# Patient Record
Sex: Female | Born: 1970 | Race: White | Hispanic: No
Health system: Midwestern US, Academic
[De-identification: ages and names within clinical notes are randomized; demographics above are authoritative.]

---

## 2018-03-14 ENCOUNTER — Encounter: Admit: 2018-03-14 | Discharge: 2018-03-14 | Payer: MEDICARE | Primary: Rheumatology

## 2018-03-14 ENCOUNTER — Ambulatory Visit: Admit: 2018-03-14 | Discharge: 2018-03-14 | Payer: MEDICARE | Primary: Rheumatology

## 2018-03-14 DIAGNOSIS — M3500 Sicca syndrome, unspecified: Principal | ICD-10-CM

## 2018-03-14 DIAGNOSIS — I7300 Raynaud's syndrome without gangrene: Secondary | ICD-10-CM

## 2018-03-14 DIAGNOSIS — I10 Essential (primary) hypertension: ICD-10-CM

## 2018-03-14 DIAGNOSIS — E785 Hyperlipidemia, unspecified: ICD-10-CM

## 2018-03-14 DIAGNOSIS — K219 Gastro-esophageal reflux disease without esophagitis: ICD-10-CM

## 2018-03-14 DIAGNOSIS — E669 Obesity, unspecified: Principal | ICD-10-CM

## 2018-03-14 LAB — C4 COMPLEMENT 4: Lab: 38 mg/dL (ref 10–49)

## 2018-03-14 LAB — HEPATITIS C ANTIBODY W REFLEX HCV PCR QUANT: Lab: NEGATIVE

## 2018-03-14 LAB — IMMUNOGLOBULINS-IGA,IGG,IGM
Lab: 102 mg/dL (ref 762–1488)
Lab: 208 mg/dL (ref 70–390)

## 2018-03-14 LAB — HEPATITIS B SURFACE AG: Lab: NEGATIVE

## 2018-03-14 LAB — HEPATITIS B CORE AB TOT (IGG+IGM): Lab: NEGATIVE

## 2018-03-14 LAB — RHEUMATOID FACTOR (RF): Lab: <10 [IU]/mL (ref <25)

## 2018-03-14 LAB — C3 COMPLEMENT 3: Lab: 153 mg/dL (ref 88–200)

## 2018-03-14 LAB — HEPATITIS B SURFACE AB

## 2018-03-14 LAB — BETA 2 MICROGLOBULIN: Lab: 2.3 mg/L (ref 0.8–2.3)

## 2018-03-15 LAB — CENTROMERE ANTIBODIES

## 2018-03-15 LAB — ANTI SSA ANTI SSB AB

## 2018-03-15 LAB — ELECTROPHORESIS-SERUM PROTEIN
Lab: 10 % (ref 5–15)
Lab: 13 % (ref 9–21)
Lab: 59 % (ref 48–68)
Lab: 6.7 g/dL (ref 6.0–8.0)

## 2018-03-15 LAB — ANTI-NUCLEAR ANTIBODY(ANA)

## 2018-03-15 LAB — IMMUNOFIXATION, SERUM (IFES)

## 2018-03-15 LAB — ANTI-NUCLEAR AB(ANA)-QUANT: Lab: 640 — ABNORMAL HIGH (ref <80)

## 2018-03-18 LAB — CRYOGLOBULIN W REFLEX IMMUNOFIXATION: Lab: NEGATIVE

## 2018-05-14 ENCOUNTER — Encounter: Admit: 2018-05-14 | Discharge: 2018-05-14 | Payer: MEDICARE | Primary: Rheumatology

## 2018-05-14 ENCOUNTER — Encounter: Admit: 2018-05-14 | Discharge: 2018-05-14 | Payer: MEDICARE

## 2018-05-14 ENCOUNTER — Ambulatory Visit: Admit: 2018-05-14 | Discharge: 2018-05-15 | Payer: MEDICARE

## 2018-05-14 DIAGNOSIS — E785 Hyperlipidemia, unspecified: Secondary | ICD-10-CM

## 2018-05-14 DIAGNOSIS — E669 Obesity, unspecified: Secondary | ICD-10-CM

## 2018-05-14 DIAGNOSIS — K219 Gastro-esophageal reflux disease without esophagitis: Secondary | ICD-10-CM

## 2018-05-14 DIAGNOSIS — I10 Essential (primary) hypertension: Secondary | ICD-10-CM

## 2018-05-14 MED ORDER — PILOCARPINE HCL 5 MG PO TAB
5 mg | ORAL_TABLET | Freq: Three times a day (TID) | ORAL | 3 refills | Status: AC
Start: 2018-05-14 — End: ?

## 2018-05-15 DIAGNOSIS — H04129 Dry eye syndrome of unspecified lacrimal gland: Secondary | ICD-10-CM

## 2018-05-15 DIAGNOSIS — R682 Dry mouth, unspecified: Secondary | ICD-10-CM

## 2018-05-15 DIAGNOSIS — M3500 Sicca syndrome, unspecified: Secondary | ICD-10-CM

## 2018-06-27 ENCOUNTER — Encounter: Admit: 2018-06-27 | Discharge: 2018-06-27 | Payer: MEDICARE

## 2018-07-01 ENCOUNTER — Encounter: Admit: 2018-07-01 | Discharge: 2018-07-01 | Payer: MEDICARE | Primary: Rheumatology

## 2018-07-01 NOTE — Telephone Encounter
Patient called and asked if there are any other prescription that the doctor can prescribed. The one that she is on currently is making her sweat.

## 2018-07-01 NOTE — Telephone Encounter
Pt called and asked if there is another medication besides pilocarpine.     Adivsed I would ask Dr. Cecelia Byars but she needs to let Dr. Coralie Carpen office know as she was placed on pilocarpine by Dr. Milus Banister.     She stated understanding.   Asked if I could mychart any responses and recs from the physician and she said yes.

## 2018-07-02 ENCOUNTER — Encounter: Admit: 2018-07-02 | Discharge: 2018-07-02 | Payer: MEDICARE | Primary: Rheumatology

## 2018-07-02 NOTE — Telephone Encounter
There is only one option other than pilocarpine called Evoxac  I see she is scheduled to see me in late March, will be happy to discuss this further with her if she would like

## 2018-07-26 ENCOUNTER — Encounter: Admit: 2018-07-26 | Discharge: 2018-07-26 | Payer: MEDICARE | Primary: Rheumatology

## 2018-07-30 ENCOUNTER — Encounter: Admit: 2018-07-30 | Discharge: 2018-07-30 | Payer: MEDICARE | Primary: Rheumatology

## 2018-08-01 ENCOUNTER — Encounter: Admit: 2018-08-01 | Discharge: 2018-08-01 | Payer: MEDICARE | Primary: Rheumatology

## 2018-08-02 NOTE — Progress Notes
4.40 pm on April 20th

## 2018-08-14 ENCOUNTER — Encounter: Admit: 2018-08-14 | Discharge: 2018-08-14 | Payer: MEDICARE | Primary: Rheumatology

## 2018-08-20 ENCOUNTER — Encounter: Admit: 2018-08-20 | Discharge: 2018-08-20 | Payer: MEDICARE | Primary: Rheumatology

## 2018-08-20 ENCOUNTER — Ambulatory Visit: Admit: 2018-08-20 | Discharge: 2018-08-21 | Payer: MEDICARE | Primary: Rheumatology

## 2018-08-20 ENCOUNTER — Encounter: Admit: 2018-08-20 | Discharge: 2018-08-20 | Payer: MEDICARE

## 2018-08-20 DIAGNOSIS — I10 Essential (primary) hypertension: ICD-10-CM

## 2018-08-20 DIAGNOSIS — E785 Hyperlipidemia, unspecified: ICD-10-CM

## 2018-08-20 DIAGNOSIS — E669 Obesity, unspecified: Principal | ICD-10-CM

## 2018-08-20 DIAGNOSIS — K219 Gastro-esophageal reflux disease without esophagitis: ICD-10-CM

## 2018-08-20 MED ORDER — NORTRIPTYLINE 10 MG PO CAP
10 mg | ORAL_CAPSULE | Freq: Every evening | ORAL | 0 refills | Status: DC
Start: 2018-08-20 — End: 2018-08-30

## 2018-08-20 MED ORDER — CEVIMELINE 30 MG PO CAP
30 mg | ORAL_CAPSULE | Freq: Three times a day (TID) | ORAL | 0 refills | Status: DC
Start: 2018-08-20 — End: 2018-09-02

## 2018-08-20 NOTE — Progress Notes
this led to excessive sweating, this usually starts 45 minutes after the dose last for several hours, and severe enough to her clothes, she also occasionally gets nausea, however, her memory problems and fatigue have been the most troublesome symptoms,  Pain mainly affects her upper and lower back, she reports aching moderately severe pain, she has been on several pharmacological therapy and is currently using maximum dose of Lyrica 450 mg daily, along with naltrexone given for GI symptoms, she tried several muscle relaxant but this led to side effects, amitriptyline previously led to increased appetite and had to be stopped but she never tried Pamelor no other complaints        Past Medical History:  Reviewed in O2    Past surgical History:  Reviewed in O2    Family History:  Family history reviewed, non-contributory      Social History:  Reviewed in O2      Allergies:   Reviewed in the records      Current medication list:  Reviewed      Physical Examination:    GENERAL APPEARANCE: The patient is a obese adult in no acute distress, pleasant    Five minute Schirmer's test without anesthesia:    OS: 7 mm     OD: 4 mm        Five minute Unstimulated salivary flow rate:    0.16  ml in 5 minutes =  0.032 ml/minute (very low)      Assessment:      1.  Fibromyalgia syndrome, classic presentation on maximal dose of lyrica and naltrexone. Tried baclofen and hydrocodone. Flexeril led to suicidal thoughts.   The plan today is to start a small dose of Pamelor 10 mg daily, unfortunately there are no other available options that we can use, she is already on high dose naltrexone given for different indication  I explained that there is no proven therapies for fatigue except aerobic exercises which she will try to do next few weeks    2.  Positive ANA    3.  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  Dry mouth with subjective evidence of dryness on unstimulated salivary flow rate

## 2018-08-21 DIAGNOSIS — M797 Fibromyalgia: Principal | ICD-10-CM

## 2018-08-22 ENCOUNTER — Encounter: Admit: 2018-08-22 | Discharge: 2018-08-22 | Payer: MEDICARE | Primary: Rheumatology

## 2018-08-30 MED ORDER — NORTRIPTYLINE 10 MG PO CAP
10 mg | ORAL_CAPSULE | Freq: Every evening | ORAL | 2 refills | Status: DC
Start: 2018-08-30 — End: 2019-02-13

## 2018-09-02 MED ORDER — CEVIMELINE 30 MG PO CAP
30 mg | ORAL_CAPSULE | Freq: Three times a day (TID) | ORAL | 2 refills | Status: DC
Start: 2018-09-02 — End: 2018-11-21

## 2018-09-11 ENCOUNTER — Encounter: Admit: 2018-09-11 | Discharge: 2018-09-11 | Payer: MEDICARE | Primary: Rheumatology

## 2018-09-24 ENCOUNTER — Encounter: Admit: 2018-09-24 | Discharge: 2018-09-24 | Payer: MEDICARE | Primary: Rheumatology

## 2018-09-25 ENCOUNTER — Encounter: Admit: 2018-09-25 | Discharge: 2018-09-25 | Payer: MEDICARE | Primary: Rheumatology

## 2018-10-08 ENCOUNTER — Encounter: Admit: 2018-10-08 | Discharge: 2018-10-08 | Primary: Rheumatology

## 2018-11-21 ENCOUNTER — Encounter: Admit: 2018-11-21 | Discharge: 2018-11-21 | Primary: Rheumatology

## 2018-11-21 DIAGNOSIS — E669 Obesity, unspecified: Secondary | ICD-10-CM

## 2018-11-21 DIAGNOSIS — K219 Gastro-esophageal reflux disease without esophagitis: Secondary | ICD-10-CM

## 2018-11-21 DIAGNOSIS — I10 Essential (primary) hypertension: Secondary | ICD-10-CM

## 2018-11-21 DIAGNOSIS — E785 Hyperlipidemia, unspecified: Secondary | ICD-10-CM

## 2018-11-21 DIAGNOSIS — Z79899 Other long term (current) drug therapy: Principal | ICD-10-CM

## 2018-11-21 MED ORDER — CEVIMELINE 30 MG PO CAP
ORAL_CAPSULE | Freq: Four times a day (QID) | ORAL | 3 refills | Status: DC
Start: 2018-11-21 — End: 2018-12-31

## 2018-11-21 MED ORDER — HYDROXYCHLOROQUINE 200 MG PO TAB
ORAL_TABLET | Freq: Every day | ORAL | 1 refills | 90.00000 days | Status: AC
Start: 2018-11-21 — End: ?

## 2018-11-21 MED ORDER — CEVIMELINE 30 MG PO CAP
30 mg | ORAL_CAPSULE | Freq: Three times a day (TID) | ORAL | 3 refills | Status: DC
Start: 2018-11-21 — End: 2018-11-21

## 2018-11-21 NOTE — Progress Notes
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 for which she was evaluated by dermatologist and work-up revealed a positive ANA, but no follow-up was performed thereafter  Subsequently she saw a new dermatologist who gave her this intralesional steroid injections and repeated her ANA along with other testing, again this came back positive for ANA but otherwise was negative for ENA, see below  She reports that over the last 6 years she has been reporting significant level of diffuse musculoskeletal pain, fatigue and muscle weakness, she hurts all over and reports that all the symptoms were preceded by 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  She also reports that over the last 3 years she has been noticing color change to her hands when she starts her car during cold weather, this would improve a few minutes later with rewarming, I showed her photos of Raynaud's phenomenon and she confirms the clinical suspicion  There is no history of digital tip ulcerations  In addition to that, she tells me that she has been dealing with dry mouth for several years, she struggled to swallow dry food without fluid and carries a bottle of water as she feels thirsty all the time  There is no history of dry eyes or prior diagnosis of eye dryness by ophthalmologist  The daughter does note intermittent history of swelling of the parotid gland area bilaterally,  Associated symptoms include short-term memory problems, nonrestorative sleep, TMJ pain, history of IBS and migraine headaches  For her neck pain she was put on pregabalin which she used for about 2 years with some relief of her neck pain  Review of systems also positive for recurrent oral thrush and history of vitamin B deficiency for which she uses multivitamin tablets   finally, she notes that, will give him prednisone for management of URI she would not notice any improvement of her musculoskeletal pain or fatigue  She had a history of gastric sleeve for management of morbid obesity  I have reviewed her medical records including a note from February 2019 she then presented with 2-day history of rashes following the Lamictal use and was started on prednisone, I see that she was found to have a positive ANA since May 2018 which was ordered by dermatology service  She was treated with Diflucan for management of thrush involving her mouth and esophagus per the notes  I have reviewed her laboratory testing from February 2019, CBC was acceptable, ANA was +1: 160 in a speckled pattern  Smith/RNP antibodies, SSA and SSB, dsDNA, SCL 70, Jo 1, centromere, and chromatin antibodies were all negative  T3 and 4 were normal RPR was negative  I also see that she underwent a 2D echo in early 2018 which was normal, peak systolic pulmonary pressure was 22    Complete ROS was performed and is otherwise negative except for the presence of joint pain, muscle pain, fatigue, weakness, dry mouth, and nonrestorative sleep as outlined above    Today:  She was last seen in April at which time I switched her from pilocarpine to you was asked for management of her sicca syndrome, it seems that she tolerated the treatment up to 3 times daily with occasional swelling and reports some  improvement of her sicca although she continues to have significant dryness of her mouth and would like to see if she can use higher doses, she also continues to report joint pain compatible with a diagnosis fibromyalgia syndrome but also pain in the hands and feet associated with prolonged morning stiffness and some swelling, we discussed hydroxychloroquine today   I gave her Pamelor for management of fibromyalgia pain last visit, unfortunately this led to significant worsening of her depression to the point that she needed to be admitted, upon discharge she was switched to Wellbutrin and Abilify and feels better now  She has no other complaints except for ongoing fatigue    Complete review of systems performed otherwise negative except for joint pain and fatigue, sicca syndrome      Past Medical History:  Reviewed in O2    Past surgical History:  Reviewed in O2    Family History:  Family history reviewed, non-contributory      Social History:  Reviewed in O2      Allergies:   Reviewed in the records      Current medication list:  Reviewed    Physical Examination:    VITAL SIGNS: BP 118/78 (BP Source: Arm, Right Upper, Patient Position: Sitting)  - Pulse 80  - Temp 36.8 ???C (98.3 ???F) (Oral)  - Resp 16  - Ht 154.9 cm (60.98)  - Wt 88.2 kg (194 lb 6.4 oz)  - SpO2 100%  - BMI 36.76 kg/m???      GENERAL APPEARANCE: The patient is a pleasant adult in no acute distress    EYES: No scleral erythema or conjunctival injection.    ENT: No oral ulcers or parotid enlargement.    NECK: No masses or thyroid enlargement.    LYMPHATIC: No cervical, supraclavicular lymphadenopathy    CARDIOVASCULAR: Heart rhythm is regular. No murmur, rub or gallop.    CHEST: Normal vesicular breath sounds. No wheezes, rales, pleural or friction rubs.    EXTREMITIES: There is no evidence of clubbing, cyanosis, or edema.    SKIN: No rash, palpable purpura, digital ulcers, abnormal thickening or tight skin    NEUROLOGICAL: Normal gait and station, full strength in upper and lower extremities, normal sensation to light touch.    MUSCULOSKELETAL: The joints of the upper and lower extremities have full range of motion, no tenderness, swelling, deformity   except:  Tenderness noted over many of her MCPs and PIPs but also transmetatarsal tenderness of both feet, some swelling is noted of some of the MCPs more noticeable on the right side Five minute Schirmer's test without anesthesia:    OS: 7 mm   OD: 4 mm        Five minute Unstimulated salivary flow rate:    0.16  ml in 5 minutes =  0.032 ml/minute (very low)      Assessment and plan:    1.  Sjogren's syndrome, symptoms include sicca syndrome, inflammatory joint pain, fatigue, late onset Raynaud's phenomenon, abnormal Schirmer's test and unstimulated salivary flow rate and positive with biopsy although the focus score was not calculated      2.  Fibromyalgia syndrome, classic presentation on maximal dose of lyrica and naltrexone. Tried baclofen and hydrocodone. Flexeril and Pamelor led to suicidal thoughts.     3.  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    We will manage her ongoing sicca syndrome by increasing dose of Evoxac to 4 times  per day, I will also initiate hydroxychloroquine for management of her inflammatory joint pain and potentially fatigue, side effects were extensively discussed including retinal toxicity, skin pigmentation on the face and neck but other parts of the body, rashes and GI disturbances, all questions were answered      Return to the clinic in approximately 3 months       Leverne Humbles, MD    Because this dictation was prepared with voice recognition software, there remains a potential for typographical errors or incorrect word choices by the system. We apologize for any inadvertent inconvenience from such an error.

## 2018-11-21 NOTE — Patient Instructions
3 month follow up    Start plaquenil    Increase evoxac to 4 times daily

## 2018-11-22 ENCOUNTER — Ambulatory Visit: Admit: 2018-11-21 | Discharge: 2018-11-22 | Primary: Rheumatology

## 2018-11-22 ENCOUNTER — Encounter: Admit: 2018-11-22 | Discharge: 2018-11-22 | Primary: Rheumatology

## 2018-11-22 NOTE — Telephone Encounter
[facility] needed clarification and diagnosis codes for hcq and cevimeline, called them and provided.

## 2018-11-25 ENCOUNTER — Encounter: Admit: 2018-11-25 | Discharge: 2018-11-25 | Primary: Rheumatology

## 2018-11-26 NOTE — Telephone Encounter
Called [facility].   They sent 10 days to start, and they are sending the rest.

## 2018-12-02 ENCOUNTER — Encounter: Admit: 2018-12-02 | Discharge: 2018-12-02 | Primary: Rheumatology

## 2018-12-04 ENCOUNTER — Encounter: Admit: 2018-12-04 | Discharge: 2018-12-04 | Primary: Rheumatology

## 2018-12-04 NOTE — Telephone Encounter
Is this with  1 tablet daily?  If so, then we will consider switching her to something else such as methotrexate and leflunomide, please send her handout

## 2018-12-11 ENCOUNTER — Encounter: Admit: 2018-12-11 | Discharge: 2018-12-11 | Primary: Rheumatology

## 2018-12-11 ENCOUNTER — Ambulatory Visit: Admit: 2018-12-11 | Discharge: 2018-12-11 | Primary: Rheumatology

## 2018-12-11 DIAGNOSIS — E669 Obesity, unspecified: Secondary | ICD-10-CM

## 2018-12-11 DIAGNOSIS — K219 Gastro-esophageal reflux disease without esophagitis: Secondary | ICD-10-CM

## 2018-12-11 DIAGNOSIS — Z5181 Encounter for therapeutic drug level monitoring: Secondary | ICD-10-CM

## 2018-12-11 DIAGNOSIS — I7300 Raynaud's syndrome without gangrene: Secondary | ICD-10-CM

## 2018-12-11 DIAGNOSIS — M1990 Unspecified osteoarthritis, unspecified site: Secondary | ICD-10-CM

## 2018-12-11 DIAGNOSIS — M797 Fibromyalgia: Secondary | ICD-10-CM

## 2018-12-11 DIAGNOSIS — E785 Hyperlipidemia, unspecified: Secondary | ICD-10-CM

## 2018-12-11 DIAGNOSIS — I10 Essential (primary) hypertension: Secondary | ICD-10-CM

## 2018-12-11 DIAGNOSIS — M3500 Sicca syndrome, unspecified: Principal | ICD-10-CM

## 2018-12-11 NOTE — Progress Notes
TELEHEALTH VISIT    Date of Service: 12/11/2018    Subjective:             Anna Thompson is a 48 y.o. female.    History of Present Illness    Chief complaint: Seronegative Sjogren's syndrome follow-up    Primary Rheumatologist:  Dr. Cecelia Byars  - Last seen 11/21/18  ???  History of present illness:   She tells me that about 6 years ago she started feeling unwell, she describes that she had central alopecia for which she was evaluated by dermatologist and work-up revealed a positive ANA, but no follow-up was performed thereafter  Subsequently she saw a new dermatologist who gave her this intralesional steroid injections and repeated her ANA along with other testing, again this came back positive for ANA but otherwise was negative for ENA, see below  She reports that over the last 6 years she has been reporting significant level of diffuse musculoskeletal pain, fatigue and muscle weakness, she hurts all over and reports that all the symptoms were preceded by 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  She also reports that over the last 3 years she has been noticing color change to her hands when she starts her car during cold weather, this would improve a few minutes later with rewarming, I showed her photos of Raynaud's phenomenon and she confirms the clinical suspicion  There is no history of digital tip ulcerations  In addition to that, she tells me that she has been dealing with dry mouth for several years, she struggled to swallow dry food without fluid and carries a bottle of water as she feels thirsty all the time  There is no history of dry eyes or prior diagnosis of eye dryness by ophthalmologist  The daughter does note intermittent history of swelling of the parotid gland area bilaterally,  Associated symptoms include short-term memory problems, nonrestorative sleep, TMJ pain, history of IBS and migraine headaches  For her neck pain she was put on pregabalin which she used for about 2 years with some relief of her neck pain  Review of systems also positive for recurrent oral thrush and history of vitamin B deficiency for which she uses multivitamin tablets   finally, she notes that, will give him prednisone for management of URI she would not notice any improvement of her musculoskeletal pain or fatigue  She had a history of gastric sleeve for management of morbid obesity  I have reviewed her medical records including a note from February 2019 she then presented with 2-day history of rashes following the Lamictal use and was started on prednisone, I see that she was found to have a positive ANA since May 2018 which was ordered by dermatology service  She was treated with Diflucan for management of thrush involving her mouth and esophagus per the notes  I have reviewed her laboratory testing from February 2019, CBC was acceptable, ANA was +1: 160 in a speckled pattern  Smith/RNP antibodies, SSA and SSB, dsDNA, SCL 70, Jo 1, centromere, and chromatin antibodies were all negative  T3 and 4 were normal RPR was negative  I also see that she underwent a 2D echo in early 2018 which was normal, peak systolic pulmonary pressure was 22  ???  Complete ROS was performed and is otherwise negative except for the presence of joint pain, muscle pain, fatigue, weakness, dry mouth, and nonrestorative sleep as outlined above  ???  Today:    She  was started on hydroxychloroquine at her last visit on 11/21/18. Unfortunately, she had nausea with it. She stopped the HCQ and it resolved.  She continues to have pain in her hands and feet. She also has knees and neck pain.  She has not noticed much swelling.   Hands are stiff in the morning for about an hour.   Oral dryness has improved with Evoxac 4 times daily. She uses restasis and saline eye drops.   She is concerned about any medicines with nausea. 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   Prednisone has not been helpful for her in the past. She does not drink alcohol or smoke.  Denies fevers, chills, recent infection/illness, cough, dyspnea, chest pain, heart palpitation, n/v/d/c, unexplained weight loss, or night sweats.          Review of Systems   HENT: Positive for tinnitus.    Eyes: Positive for photophobia and itching.   Gastrointestinal: Positive for constipation.   Endocrine: Positive for cold intolerance.   Genitourinary: Positive for frequency and urgency.   Musculoskeletal: Positive for arthralgias, back pain, neck pain and neck stiffness.   Allergic/Immunologic: Positive for immunocompromised state.   Neurological: Positive for dizziness, speech difficulty and light-headedness.   Hematological: Bruises/bleeds easily.   Psychiatric/Behavioral: Positive for sleep disturbance.   All other systems reviewed and are negative.    Medical History:   Diagnosis Date   ??? Essential hypertension 05/09/2016   ??? GERD (gastroesophageal reflux disease) 05/09/2016   ??? Hyperlipemia 05/09/2016   ??? Obesity 05/09/2016     Surgical History:   Procedure Laterality Date   ??? BLADDER SURGERY      bladder sling   ??? FINGER TRIGGER RELEASE Left    ??? HX CARPAL TUNNEL RELEASE     ??? HX HYSTERECTOMY     ??? HX TUBAL LIGATION     ??? STOMACH SURGERY      gastric sleeve     Family History   Problem Relation Age of Onset   ??? Stroke Mother    ??? Heart Attack Father    ??? Heart Disease Father    ??? Heart Attack Brother    ??? Hypertension Brother    ??? Heart Attack Brother    ??? Hypertension Brother      Social History     Socioeconomic History   ??? Marital status: Divorced     Spouse name: Not on file   ??? Number of children: Not on file   ??? Years of education: Not on file   ??? Highest education level: Not on file   Occupational History   ??? Not on file   Tobacco Use   ??? Smoking status: Never Smoker   ??? Smokeless tobacco: Never Used   Substance and Sexual Activity   ??? Alcohol use: No   ??? Drug use: No   ??? Sexual activity: Not on file   Other Topics Concern   ??? Not on file   Social History Narrative ??? Not on file           Objective:         ??? ARIPiprazole (ABILIFY) 5 mg tablet Take 5 mg by mouth daily.   ??? buPROPion XL (WELLBUTRIN XL) 300 mg tablet Take 300 mg by mouth every morning. Do not crush or chew.   ??? calcium carbonate/vitamin D-3 (OSCAL-500+D) 1250 mg/200 unit tablet Take 1 tablet by mouth daily. Calcium Carb 1250mg  delivers 500mg  elemental Ca   ???  cevimeline (EVOXAC) 30 mg capsule One tab 4 times daily   ??? cyanocobalamin (VITAMIN B-12) 500 mcg tablet Take 500 mcg by mouth daily.   ??? cycloSPORINE (RESTASIS) 0.05 % ophthalmic emulsion Apply 1 drop to both eyes twice daily.   ??? ferrous sulfate (FEOSOL) 325 mg (65 mg iron) tablet Take 325 mg by mouth daily. Take on an empty stomach at least 1 hour before or 2 hours after food.   ??? hydrOXYchloroQUINE (PLAQUENIL) 200 mg tablet Take with food. Once daily for 2 weeks therm twice daily   ??? LORazepam (ATIVAN) 0.5 mg tablet Take 1 tablet by mouth as Needed.   ??? metaxalone(+) (SKELAXIN) 800 mg tablet Take 800 mg by mouth three times daily.   ??? montelukast (SINGULAIR) 10 mg tablet Take 10 mg by mouth at bedtime daily.   ??? MULTIVITAMIN PO Take  by mouth.   ??? naltrexone (DEPADE) 50 mg tablet Take 25 mg by mouth twice daily.   ??? nortriptyline (PAMELOR) 10 mg capsule Take one capsule by mouth at bedtime daily.   ??? omeprazole DR(+) (PRILOSEC) 40 mg capsule Take 40 mg by mouth twice daily.   ??? ondansetron (ZOFRAN) 4 mg tablet Take 4 mg by mouth every 8 hours as needed for Nausea or Vomiting.   ??? other medication 1 Dose. botox injections   ??? phytonadione (VITAMIN K) 5 mg tablet Take 5 mg by mouth once.   ??? pregabalin (LYRICA) 150 mg capsule Take 75 mg by mouth twice daily.   ??? promethazine (PHENERGAN) 25 mg tablet Take 25 mg by mouth every 6 hours as needed for Nausea or Vomiting.   ??? VIIBRYD 40 mg tab Take 1 tablet by mouth daily.   ??? vitamins, multi B, C, Zn & folate (Renal) (NEPHPLEX RX) 1-60-300-12.5 mg-mg-mcg-mg tab Take 1 tablet by mouth daily. ??? zolpidem (AMBIEN) 10 mg tablet Take 1 tablet by mouth at bedtime daily.     Vitals:    12/11/18 1429   Weight: 85.7 kg (189 lb)   Height: 154.9 cm (60.98)   PainSc: Four     Body mass index is 35.73 kg/m???.     Physical Exam  Constitutional:       General: She is not in acute distress.     Appearance: Normal appearance. She is not ill-appearing or toxic-appearing.      Comments: Limited physical exam d/t telehealth visit.    Pulmonary:      Effort: No respiratory distress.   Musculoskeletal:      Comments: No areas of obvious synovitis noted.    Skin:     Findings: No rash.   Neurological:      Mental Status: She is alert.   Psychiatric:         Mood and Affect: Mood normal.         Behavior: Behavior normal.              Assessment and Plan:    1. Sjogren's syndrome, symptoms include sicca syndrome, inflammatory joint pain, fatigue, late onset Raynaud's phenomenon, abnormal Schirmer's test and unstimulated salivary flow rate and positive with biopsy. Oral dryness has improved with evoxac 4 times daily. She continues to have joint pain and morning stiffness consistent with inflammatory joint pain. Unfortunately, she was unable to tolerate hydroxychlorquine.     Given that she has difficulty tolerating oral medications d/t her bariatric surgery, we discussed using SQ MTX. We Reviewed indications, potential risks, side effects, toxicities, immunosuppressive/immunomodulatory effects and need  for ongoing safety monitoring. She was comfortable with this plan. We will need updated labs prior to starting. Labs will be faxed to her local hospital and then she will send [place] the results electronically so we can start Methotrexate soon.     Once labs are reviewed, will start MTX SQ 10mg  (0.66mls) once weekly x 2 weeks, 12.5mg  (0.45mls) once weekly x 2 weeks, then 15mg  (0.3mls) once weekly. She uses OTC folic acid and would prefer to keep using, but I asked her to check to the dose to make sure she's taking at least 1mg  ( ) daily while on Methotrexate. I will ask our pharmacists to reach out to her as well to discuss and possibly set up an injection teach visit although she did mention she has given SQ injections to her mother in the past and is fairly comfortable with it.   ???  2.  Fibromyalgia syndrome, classic presentation on maximal dose of lyrica and naltrexone. Tried baclofen and hydrocodone. Flexeril and Pamelor led to suicidal thoughts.   ???  3.  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  She has a scheduled follow-up with Dr. Cecelia Byars in October 2020. Will keep this appointment, but I can see her back sooner via telehealth if there are problems/concerns prior to this appointment.       APRN, FNP-BC               Total time 20 minutes.

## 2018-12-12 ENCOUNTER — Encounter: Admit: 2018-12-12 | Discharge: 2018-12-12 | Primary: Rheumatology

## 2018-12-12 NOTE — Progress Notes
Attempted to reach patient for methotrexate education. Left voicemail requesting a call back. Provided patient with pharmacist clinic phone number ([phone]). If no return call from patient, will attempt again at a later date.

## 2018-12-12 NOTE — Telephone Encounter
-----   Message from [place], [place] sent at 12/12/2018  8:32 AM CDT -----  Regarding: FW: SQ MTX    ----- Message -----  From: Rance Muir, APRN-NP  Sent: 12/11/2018   3:57 PM CDT  To: Dwyane Luo, PHARMD, Jeneen Rinks, PHARMD  Subject: SQ MTX                                           Hello,    Can you please call Ms. Fusco to discuss SQ MTX? I spoke to her today via telehealth and we had a pretty long discussion about it so I don't think she will have many additional questions. I did mention you may suggest a telehealth visit for an injection teach once she gets the Methotrexate. She hasn't had labs in a few years, so I'm having her get labs this week and then will order the MTX once we get those results back.    Thanks so much!    [facility]

## 2018-12-12 NOTE — Telephone Encounter
-----   Message from Rance Muir, APRN-NP sent at 12/11/2018  3:27 PM CDT -----  Regarding: Labs  Hi Izora Gala,    Can you fax over the standing labs I ordered for Shera to [facility]?     She's going to get labs this week and then will send [place] the results so we can start her on Methotrexate.    Thanks!    [facility]

## 2018-12-12 NOTE — Telephone Encounter
Labs faxed to [facility]   [phone]

## 2018-12-16 ENCOUNTER — Encounter: Admit: 2018-12-16 | Discharge: 2018-12-16 | Payer: MEDICARE | Primary: Rheumatology

## 2018-12-16 MED ORDER — BD INSULIN SYRINGE 1 ML 25 GAUGE X 5/8" MISC SYRG
1 refills | 30.00000 days | Status: AC
Start: 2018-12-16 — End: ?

## 2018-12-16 MED ORDER — METHOTREXATE SODIUM (PF) 25 MG/ML IJ SOLN
0 refills | 28.00000 days | Status: DC
Start: 2018-12-16 — End: 2018-12-20

## 2018-12-16 NOTE — Telephone Encounter
PA started on [facility] Key: [id number]  PA Case ID: [id number]

## 2018-12-18 NOTE — Progress Notes
Returned patient's phone call inquiring about methotrexate injections. Anna Thompson called to verify how many mL she needs to draw up on syringe prior to giving herself the first injection. Reviewed dosing and how much to draw up in syringe. Anna Thompson feels comfortable giving injection as she has given her mother insulin shots for years.     Offered education on the methotrexate in terms of side effects, however, patient declined and said she already received information from her physician and retail pharmacist.

## 2018-12-20 MED ORDER — METHOTREXATE SODIUM (PF) 25 MG/ML IJ SOLN
0 refills | 28.00000 days | Status: DC
Start: 2018-12-20 — End: 2019-03-17

## 2018-12-20 NOTE — Progress Notes
MTX sent to [facility] pharmacy. Script sent.

## 2018-12-24 ENCOUNTER — Encounter: Admit: 2018-12-24 | Discharge: 2018-12-24 | Primary: Rheumatology

## 2018-12-24 NOTE — Telephone Encounter
I agree with stopping methotrexate 2 weeks before the planned surgery and to resume therapy when instructed to do so by her surgeon

## 2018-12-31 ENCOUNTER — Encounter: Admit: 2018-12-31 | Discharge: 2018-12-31 | Primary: Rheumatology

## 2018-12-31 MED ORDER — CEVIMELINE 30 MG PO CAP
ORAL_CAPSULE | Freq: Four times a day (QID) | ORAL | 3 refills | Status: DC
Start: 2018-12-31 — End: 2019-04-07

## 2019-01-14 ENCOUNTER — Encounter: Admit: 2019-01-14 | Discharge: 2019-01-14 | Payer: MEDICARE | Primary: Rheumatology

## 2019-01-15 ENCOUNTER — Encounter: Admit: 2019-01-15 | Discharge: 2019-01-15 | Payer: MEDICARE | Primary: Rheumatology

## 2019-01-20 ENCOUNTER — Encounter: Admit: 2019-01-20 | Discharge: 2019-01-20 | Payer: MEDICARE | Primary: Rheumatology

## 2019-01-22 ENCOUNTER — Encounter: Admit: 2019-01-22 | Discharge: 2019-01-22 | Payer: MEDICARE | Primary: Rheumatology

## 2019-01-31 LAB — CBC AND DIFF

## 2019-01-31 LAB — ALT (SGPT)

## 2019-01-31 LAB — CREATININE

## 2019-02-13 ENCOUNTER — Encounter: Admit: 2019-02-13 | Discharge: 2019-02-13 | Payer: MEDICARE | Primary: Rheumatology

## 2019-02-13 ENCOUNTER — Ambulatory Visit: Admit: 2019-02-13 | Discharge: 2019-02-14 | Payer: MEDICARE | Primary: Rheumatology

## 2019-02-13 DIAGNOSIS — I10 Essential (primary) hypertension: Secondary | ICD-10-CM

## 2019-02-13 DIAGNOSIS — K219 Gastro-esophageal reflux disease without esophagitis: Secondary | ICD-10-CM

## 2019-02-13 DIAGNOSIS — E669 Obesity, unspecified: Secondary | ICD-10-CM

## 2019-02-13 DIAGNOSIS — E785 Hyperlipidemia, unspecified: Secondary | ICD-10-CM

## 2019-02-13 DIAGNOSIS — M3500 Sicca syndrome, unspecified: Secondary | ICD-10-CM

## 2019-02-13 NOTE — Progress Notes
Obtained patient's verbal consent to treat them and their agreement to [facility] financial policy and NPP via this telehealth visit during the [facility]     Anna Thompson is a 48 year old Caucasian female here for  a follow-up visit    Chief complaint: Seronegative Sjogren's syndrome follow-up    Today:  Last seen in July, we increased the dose of cevimeline to manage her sicca syndrome to 4 times per day, this resulted in worsening of baseline swelling she has been dealing with for about 5 years, she tells me she has drenching sweats that she changes her clothes at night several times, this pattern has not changed after she started Evoxac however, she decreased the dose to 2 tablets/day with significant improvement of her sicca symptoms, we gave her hydroxychloroquine for management of inflammatory arthritis but she had GI side effects and had to stop, she was seen in August by my colleague Trinda Pascal who switch her to subcutaneous methotrexate, 15 mg weekly, however, therapy was interrupted in September due to knee surgery, for approximately 2 weeks she had to stop therapy, she feels methotrexate has helped her joint pain but continues to be uncomfortable, she previously had aching moderately severe pain affecting her hands and feet associated with prolonged morning stiffness,  She also notes that methotrexate resulted in some increased level of fatigue for 2 days after each dose but this is tolerable  Importantly she has had 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 which completely resolved after she started methotrexate, she is pleased with that    No other complaints    Complete review of systems performed otherwise negative except for joint pain and post methotrexate fatigue, improved sicca syndrome and resolved psoriasis      Past Medical History:  Reviewed in O2    Past surgical History:  Reviewed in O2    Family History:  Family history reviewed, non-contributory      Social History: Reviewed in O2      Allergies:   Reviewed in the records      Current medication list:  Reviewed        Five minute Schirmer's test without anesthesia:    OS: 7 mm   OD: 4 mm        Five minute Unstimulated salivary flow rate:    0.16  ml in 5 minutes =  0.032 ml/minute (very low)      Assessment and plan:    1.  Sjogren's syndrome, symptoms include sicca syndrome, inflammatory joint pain, fatigue, late onset Raynaud's phenomenon, abnormal Schirmer's test and unstimulated salivary flow rate and positive with biopsy although the focus score was not calculated    She seems to be doing well on methotrexate 50 mg subcu weekly, although she notices increased fatigue for 2 days after each dose, I told her this typically improves with continued use, she is comfortable continuing treatment and perceives it to be helpful managing some of her joint pain but this also resulted in resolution of her plaque psoriasis    We will repeat her toxicity monitoring labs in 1 month and I will see her back in mid December    With regard to Evoxac, she is happy with that its use twice daily due to improvement of her sicca symptoms however, she continues to have baseline excessive sweating particularly at nighttime despite the recent adjustment of the dose, this has been occurring for 5 years and is not associated with loss of appetite, her  weight has been changing in the context of recent bariatric surgery and change in the dietary habits,, therefore I told her I am not clear about the exact etiology of her excessive sweating, it may be related to unusual affect of Sjogren's syndrome on her small fiber nerves supplying the swelling glands, any therapy targeting this may result in worsening of her sicca symptoms, she expressed understanding  In the meantime, she will try to stop Evoxac for 2 weeks and see what happens then gauge her response and finds the best dose of Evoxac that would minimize side effects while maximizing improvement, she seems comfortable with    2.  Fibromyalgia syndrome, classic presentation on maximal dose of lyrica and naltrexone. Tried baclofen and hydrocodone. Flexeril and Pamelor led to suicidal thoughts.     3.  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  Long-term immunosuppression use, methotrexate started in August 2020    6.  Intolerance to hydroxychloroquine due to GI side effects    7.  Status post bariatric surgery      Return to the clinic in approximately 3 months       Leverne Humbles, MD    Because this dictation was prepared with voice recognition software, there remains a potential for typographical errors or incorrect word choices by the system. We apologize for any inadvertent inconvenience from such an error.

## 2019-02-26 ENCOUNTER — Encounter: Admit: 2019-02-26 | Discharge: 2019-02-26 | Payer: MEDICARE | Primary: Rheumatology

## 2019-03-07 ENCOUNTER — Encounter: Admit: 2019-03-07 | Discharge: 2019-03-07 | Payer: MEDICARE | Primary: Rheumatology

## 2019-03-07 DIAGNOSIS — M3500 Sicca syndrome, unspecified: Secondary | ICD-10-CM

## 2019-03-07 DIAGNOSIS — Z5181 Encounter for therapeutic drug level monitoring: Secondary | ICD-10-CM

## 2019-03-17 ENCOUNTER — Encounter: Admit: 2019-03-17 | Discharge: 2019-03-17 | Payer: MEDICARE | Primary: Rheumatology

## 2019-03-17 MED ORDER — METHOTREXATE SODIUM (PF) 25 MG/ML IJ SOLN
15 mg | SUBCUTANEOUS | 0 refills | Status: AC
Start: 2019-03-17 — End: ?

## 2019-03-20 ENCOUNTER — Encounter: Admit: 2019-03-20 | Discharge: 2019-03-20 | Payer: MEDICARE | Primary: Rheumatology

## 2019-03-20 DIAGNOSIS — R82998 Other abnormal findings in urine: Secondary | ICD-10-CM

## 2019-03-20 NOTE — Telephone Encounter
I do not think this is a common side effect of methotrexate, therefore I would like to rule out hematuria in her case, lets go ahead and send the new set of urinalysis orders

## 2019-03-26 ENCOUNTER — Encounter: Admit: 2019-03-26 | Discharge: 2019-03-26 | Payer: MEDICARE | Primary: Rheumatology

## 2019-03-26 DIAGNOSIS — R82998 Other abnormal findings in urine: Secondary | ICD-10-CM

## 2019-04-07 ENCOUNTER — Encounter: Admit: 2019-04-07 | Discharge: 2019-04-07 | Payer: MEDICARE | Primary: Rheumatology

## 2019-04-07 MED ORDER — CEVIMELINE 30 MG PO CAP
30 mg | ORAL_CAPSULE | Freq: Four times a day (QID) | ORAL | 1 refills | Status: DC
Start: 2019-04-07 — End: 2019-09-19

## 2019-04-22 ENCOUNTER — Encounter: Admit: 2019-04-22 | Discharge: 2019-04-22 | Payer: MEDICARE | Primary: Rheumatology

## 2019-04-22 MED ORDER — METHYLPREDNISOLONE 4 MG PO DSPK
ORAL_TABLET | 0 refills | Status: AC
Start: 2019-04-22 — End: ?

## 2019-04-22 NOTE — Telephone Encounter
Fatigue may be related to Sjogren's syndrome but also fibromyalgia, and it is very hard to tell, if she is open to trying a Medrol Dosepak I will be happy to prescribe her that that she should watch herself, positive response may suggest additional immunosuppression may play a role in managing her fatigue which seems to be significant, if she is comfortable with that I will go ahead and prescribe it

## 2019-04-28 ENCOUNTER — Encounter: Admit: 2019-04-28 | Discharge: 2019-04-28 | Payer: MEDICARE | Primary: Rheumatology

## 2019-04-28 NOTE — Telephone Encounter
It would be fine with me to stop methotrexate, given the ongoing joint pain as well as the presence of select psoriasis the next step would be to consider using TNF inhibitors so please go ahead and send her handout about this, if she is comfortable with that we will have one of our pharmacist colleagues speak with her and potentially start therapy

## 2019-04-28 NOTE — Telephone Encounter
Etanercept would be once a week and adalimumab will be once every 2 weeks, I am CCing [place] adn Kathlee Nations on this

## 2019-04-30 ENCOUNTER — Encounter: Admit: 2019-04-30 | Discharge: 2019-04-30 | Payer: MEDICARE | Primary: Rheumatology

## 2019-05-01 ENCOUNTER — Encounter: Admit: 2019-05-01 | Discharge: 2019-05-01 | Payer: MEDICARE | Primary: Rheumatology

## 2019-05-01 MED ORDER — ADALIMUMAB 40 MG/0.4 ML SC PNKT
40 mg | SUBCUTANEOUS | 5 refills | Status: DC
Start: 2019-05-01 — End: 2019-09-01
  Filled 2019-05-22: qty 1, 28d supply, fill #1

## 2019-05-01 NOTE — Progress Notes
Discussed anti-TNFs with patient. After discussion, will plan for Humira.    Sending script to [place].

## 2019-05-01 NOTE — Progress Notes
The Prior Authorization for Humira was approved for Anna Thompson from 01/31/19 to 04/30/2022.  The copay is $0.     The ambulatory pharmacist has been notified of the approval in order to provide education prior to dispense of the medication.  Will await notification from the pharmacist that it is OK to set up the fill per the patient's preferred delivery method.      Fredda HammedLindsay   Pharmacy Patient Advocate  [phone]

## 2019-05-05 ENCOUNTER — Encounter: Admit: 2019-05-05 | Discharge: 2019-05-05 | Payer: MEDICARE | Primary: Rheumatology

## 2019-05-05 DIAGNOSIS — Z111 Encounter for screening for respiratory tuberculosis: Secondary | ICD-10-CM

## 2019-05-06 ENCOUNTER — Encounter: Admit: 2019-05-06 | Discharge: 2019-05-06 | Payer: MEDICARE | Primary: Rheumatology

## 2019-05-13 ENCOUNTER — Encounter: Admit: 2019-05-13 | Discharge: 2019-05-13 | Payer: MEDICARE | Primary: Rheumatology

## 2019-05-13 NOTE — Progress Notes
[facility] lab. TB test completed on 1/7. Unable to provide results. Requested that the result to be faxed to clinic. Will await results prior to calling to provide education.

## 2019-05-14 ENCOUNTER — Encounter: Admit: 2019-05-14 | Discharge: 2019-05-14 | Payer: MEDICARE | Primary: Rheumatology

## 2019-05-14 DIAGNOSIS — Z111 Encounter for screening for respiratory tuberculosis: Secondary | ICD-10-CM

## 2019-05-15 ENCOUNTER — Encounter: Admit: 2019-05-15 | Discharge: 2019-05-15 | Payer: MEDICARE | Primary: Rheumatology

## 2019-05-15 NOTE — Telephone Encounter
Attempted to reach patient to review Humira and schedule shipment. Left voicemail requesting a call back. Provided patient with pharmacist clinic phone number [phone]). If no return call from patient, will attempt again at a later date.

## 2019-05-15 NOTE — Telephone Encounter
Rheumatology Clinic Pharmacist Medication Education    Comfort level with therapy on a scale of 1 (not comfortable at all) to 5 (very comfortable) prior to education: 3    Patient previously educated on Humira.    Baseline labs have been collected and evaluated.     The patient's ability to self-administer medication was assessed. The patient was educated on proper dosing and administration technique for their therapy as well as the planned duration of therapy. Appropriate self-handling, storage and disposal directions were reviewed with the patient.     Contraindications to therapy, safety precautions, and common side effects were discussed with the patient. They were instructed to seek medical attention immediately if they experience signs of an allergic reaction, including but not limited to: a rash; hives; itching; red, swollen, blistered, or peeling skin with or without fever.    Requirements of the REMS program were discussed with the patient as applicable.     Appropriate recommended vaccinations were reviewed and discussed with the patient.    A medication history and reconciliation was performed (including prescription medications, supplements, over the counter, and herbal products). The medication list was updated and the patients current medication list is listed below.     Home Medications    Medication Sig   adalimumab (CF) (HUMIRA PEN) 40 mg/0.4 mL injection PEN kit Inject 0.4 mL under the skin every 14 days.   ARIPiprazole (ABILIFY) 5 mg tablet Take 5 mg by mouth daily.   buPROPion XL (WELLBUTRIN XL) 300 mg tablet Take 300 mg by mouth every morning. Do not crush or chew.   calcium carbonate/vitamin D-3 (OSCAL-500+D) 1250 mg/200 unit tablet Take 1 tablet by mouth daily. Calcium Carb 1250mg  delivers 500mg  elemental Ca   cevimeline (EVOXAC) 30 mg capsule Take one capsule by mouth four times daily. One tab 4 times daily   cyanocobalamin (VITAMIN B-12) 500 mcg tablet Take 500 mcg by mouth daily. cycloSPORINE (RESTASIS) 0.05 % ophthalmic emulsion Apply 1 drop to both eyes twice daily.   ferrous sulfate (FEOSOL) 325 mg (65 mg iron) tablet Take 325 mg by mouth daily. Take on an empty stomach at least 1 hour before or 2 hours after food.   hydrOXYchloroQUINE (PLAQUENIL) 200 mg tablet Take with food. Once daily for 2 weeks therm twice daily   Insulin Syringe-Needle U-100 (BD INSULIN SYRINGE) 1 mL 25 gauge x 5/8 syrg Use with Methotrexate injections   methotrexate PF 25 mg/mL injection Inject 0.6 mL under the skin every 7 days. Discard vial after use.   methylPREDNIsolone (MEDROL DOSEPAK) 4 mg tablet Take medication as directed on package for 6 days. Take with food.   montelukast (SINGULAIR) 10 mg tablet Take 10 mg by mouth at bedtime daily.   MULTIVITAMIN PO Take  by mouth.   naltrexone (DEPADE) 50 mg tablet Take 25 mg by mouth twice daily.   omeprazole DR(+) (PRILOSEC) 40 mg capsule Take 40 mg by mouth twice daily.   ondansetron (ZOFRAN) 4 mg tablet Take 4 mg by mouth every 8 hours as needed for Nausea or Vomiting.   other medication 1 Dose. botox injections   phytonadione (VITAMIN K) 5 mg tablet Take 5 mg by mouth once.   pregabalin (LYRICA) 150 mg capsule Take 75 mg by mouth twice daily.   promethazine (PHENERGAN) 25 mg tablet Take 25 mg by mouth every 6 hours as needed for Nausea or Vomiting.   VIIBRYD 40 mg tab Take 1 tablet by mouth daily.   vitamins, multi B,  C, Zn & folate (Renal) (NEPHPLEX RX) 1-60-300-12.5 mg-mg-mcg-mg tab Take 1 tablet by mouth daily.        Drug-drug and drug-food interactions with the new therapy were assessed and reviewed with the patient. The patient was instructed to speak with their health care provider before starting any new drug, including prescription or over the counter, natural products, or vitamins.    Pregnancy potential was reviewed with the patient. Female, not of child-bearing potential: education not applicable. The monitoring and follow-up plan was discussed with the patient. The patient was instructed to contact their health care provider if their symptoms or health problems do not get better or if they become worse. Anna Thompson was instructed to contact pharmacist at [phone] if they have any questions or concerns regarding their medication therapy.     Patient fills at the [facility] pharmacy.    Comfort level with therapy on a scale of 1 (not comfortable at all) to 5 (very comfortable) after education: 5    Additional discussions: None      Anna Thompson was given the opportunity to ask questions and did not have any questions at this time.    Marni Griffon, PHARMD

## 2019-05-15 NOTE — Telephone Encounter
Pharmacy Initial Medication Assessment    Indication / Regimen   adalimumab (Humira) is being used for the appropriate indication of inflammatory arthritis.      Prescribed maintenance regimen: 40mg  SC every 2 weeks . Plan to continue indefinitely. The medication(s) have been appropriately dosed based on the patients renal and/or hepatic function.    At this time there are no planned titration/loading doses    The patient has the ability to self-administer the medication.     Therapeutic goals:reduce pain and improve quality of life    Patient fills at the [facility] pharmacy.    Baseline Characteristics   Level of disease activity: not assessed at last office visit   Current DMARDs:None   Previous DMARDs:HCQ and MTX   Flare in the last 30 days: No   Additional considerations/co-morbidities: psoriasis    TB Screening  05/08/19    Hepatitis B Screening  HBsAg   Date/Time Value Ref Range Status   03/14/2018 10:25 AM NEG NEG-NEG Final     Anti HBc Total   Date Value Ref Range Status   03/14/2018 NEG  Final     Anti HBs   Date/Time Value Ref Range Status   03/14/2018 10:25 AM <2.5 mIU/ml Final     Comment:                           Hepatitis B Surface Antibody Reference Ranges                        >12.0     Positive  8.0-12.0  Equivocal  <8.0      Negative          Other Pertinent Labs   NA    Baseline labs have been collected and evaluated.     Allergies   Allergies   Allergen Reactions   ? Flexeril [Cyclobenzaprine] SEE COMMENTS     Suicidal Ideations   ? Baclofen EDEMA   ? Propranolol SEE COMMENTS     Bradycardia   ? Doxycycline VOMITING   ? Topamax [Topiramate] UNKNOWN        Vaccination Status    There is no immunization history on file for this patient.  Vaccine history will be reviewed with patient. Patient will be reminded about the importance of receiving an annual influenza vaccine as well as the pneumococcal and shingles vaccines if indicated.    Pregnancy Status Pregnancy status was assessed and determined to be: Female, not of child-bearing potential: education not applicable.     Past Medical History  Medical History:   Diagnosis Date   ? Essential hypertension 05/09/2016   ? GERD (gastroesophageal reflux disease) 05/09/2016   ? Hyperlipemia 05/09/2016   ? Obesity 05/09/2016       Medication Reconciliation  Medication reconciliation is based on the patient?s most recent med list in electronic medical record (EMR) including herbal products and OTC medications. The patients? medication list will be updated during patient education, after speaking with the patient and prior to dispensing the medication.     Home Medications    Medication Sig   adalimumab (CF) (HUMIRA PEN) 40 mg/0.4 mL injection PEN kit Inject 0.4 mL under the skin every 14 days.   ARIPiprazole (ABILIFY) 5 mg tablet Take 5 mg by mouth daily.   buPROPion XL (WELLBUTRIN XL) 300 mg tablet Take 300 mg by mouth every morning. Do not crush  or chew.   calcium carbonate/vitamin D-3 (OSCAL-500+D) 1250 mg/200 unit tablet Take 1 tablet by mouth daily. Calcium Carb 1250mg  delivers 500mg  elemental Ca   cevimeline (EVOXAC) 30 mg capsule Take one capsule by mouth four times daily. One tab 4 times daily   cyanocobalamin (VITAMIN B-12) 500 mcg tablet Take 500 mcg by mouth daily.   cycloSPORINE (RESTASIS) 0.05 % ophthalmic emulsion Apply 1 drop to both eyes twice daily.   ferrous sulfate (FEOSOL) 325 mg (65 mg iron) tablet Take 325 mg by mouth daily. Take on an empty stomach at least 1 hour before or 2 hours after food.   hydrOXYchloroQUINE (PLAQUENIL) 200 mg tablet Take with food. Once daily for 2 weeks therm twice daily   Insulin Syringe-Needle U-100 (BD INSULIN SYRINGE) 1 mL 25 gauge x 5/8 syrg Use with Methotrexate injections   methotrexate PF 25 mg/mL injection Inject 0.6 mL under the skin every 7 days. Discard vial after use. methylPREDNIsolone (MEDROL DOSEPAK) 4 mg tablet Take medication as directed on package for 6 days. Take with food.   montelukast (SINGULAIR) 10 mg tablet Take 10 mg by mouth at bedtime daily.   MULTIVITAMIN PO Take  by mouth.   naltrexone (DEPADE) 50 mg tablet Take 25 mg by mouth twice daily.   omeprazole DR(+) (PRILOSEC) 40 mg capsule Take 40 mg by mouth twice daily.   ondansetron (ZOFRAN) 4 mg tablet Take 4 mg by mouth every 8 hours as needed for Nausea or Vomiting.   other medication 1 Dose. botox injections   phytonadione (VITAMIN K) 5 mg tablet Take 5 mg by mouth once.   pregabalin (LYRICA) 150 mg capsule Take 75 mg by mouth twice daily.   promethazine (PHENERGAN) 25 mg tablet Take 25 mg by mouth every 6 hours as needed for Nausea or Vomiting.   VIIBRYD 40 mg tab Take 1 tablet by mouth daily.   vitamins, multi B, C, Zn & folate (Renal) (NEPHPLEX RX) 1-60-300-12.5 mg-mg-mcg-mg tab Take 1 tablet by mouth daily.       Drug-Drug Interactions  No new significant drug-drug or drug-food interactions were identified.    Drug-Food Interactions  There are no significant drug-food interactions. This medication can be taken with or without food.     Contraindications  SUANNA STROMAIN has no contraindications to this medication.    Safety Precautions  Safety precautions were evaluated and discussed with patient as applicable.     Risk Evaluation and Mitigation Strategy (REMS) Assessment  No REMS are required for any of the prescribed medications for this patient.      Medication Education  Patient will be contacted to complete education on therapy.      Follow-up Plan   -Initial therapy assessment has been completed  -Patient will be reassessed within 3 months.      Marni Griffon, PHARMD

## 2019-05-22 ENCOUNTER — Encounter: Admit: 2019-05-22 | Discharge: 2019-05-22 | Payer: MEDICARE | Primary: Rheumatology

## 2019-06-01 ENCOUNTER — Encounter: Admit: 2019-06-01 | Discharge: 2019-06-01 | Payer: MEDICARE | Primary: Rheumatology

## 2019-06-18 ENCOUNTER — Encounter: Admit: 2019-06-18 | Discharge: 2019-06-18 | Payer: MEDICARE | Primary: Rheumatology

## 2019-06-20 ENCOUNTER — Encounter: Admit: 2019-06-20 | Discharge: 2019-06-20 | Payer: MEDICARE | Primary: Rheumatology

## 2019-07-03 ENCOUNTER — Encounter: Admit: 2019-07-03 | Discharge: 2019-07-03 | Payer: MEDICARE | Primary: Rheumatology

## 2019-07-03 MED FILL — ADALIMUMAB 40 MG/0.4 ML SC PNKT: 40 mg/0.4 mL | SUBCUTANEOUS | 28 days supply | Qty: 0 | Fill #2 | Status: AC

## 2019-07-09 ENCOUNTER — Encounter: Admit: 2019-07-09 | Discharge: 2019-07-09 | Payer: MEDICARE | Primary: Rheumatology

## 2019-07-30 ENCOUNTER — Encounter: Admit: 2019-07-30 | Discharge: 2019-07-30 | Payer: MEDICARE | Primary: Rheumatology

## 2019-08-02 ENCOUNTER — Encounter: Admit: 2019-08-02 | Discharge: 2019-08-02 | Payer: MEDICARE | Primary: Rheumatology

## 2019-08-03 ENCOUNTER — Encounter: Admit: 2019-08-03 | Discharge: 2019-08-03 | Payer: MEDICARE | Primary: Rheumatology

## 2019-08-04 MED FILL — ADALIMUMAB 40 MG/0.4 ML SC PNKT: 40 mg/0.4 mL | SUBCUTANEOUS | 28 days supply | Qty: 0 | Fill #3 | Status: AC

## 2019-08-06 ENCOUNTER — Encounter: Admit: 2019-08-06 | Discharge: 2019-08-06 | Payer: MEDICARE | Primary: Rheumatology

## 2019-08-06 NOTE — Telephone Encounter
Attempted to reach patient to complete Humira 3 month reassessment. Left voicemail requesting a call back. Provided patient with pharmacist clinic phone number ([phone]). If no return call from patient, will attempt again at a later date.

## 2019-09-01 ENCOUNTER — Encounter: Admit: 2019-09-01 | Discharge: 2019-09-01 | Payer: MEDICARE | Primary: Rheumatology

## 2019-09-01 ENCOUNTER — Ambulatory Visit: Admit: 2019-09-01 | Discharge: 2019-09-02 | Payer: MEDICARE | Primary: Rheumatology

## 2019-09-01 DIAGNOSIS — K219 Gastro-esophageal reflux disease without esophagitis: Secondary | ICD-10-CM

## 2019-09-01 DIAGNOSIS — E669 Obesity, unspecified: Secondary | ICD-10-CM

## 2019-09-01 DIAGNOSIS — I10 Essential (primary) hypertension: Secondary | ICD-10-CM

## 2019-09-01 DIAGNOSIS — E785 Hyperlipidemia, unspecified: Secondary | ICD-10-CM

## 2019-09-01 DIAGNOSIS — Z79899 Other long term (current) drug therapy: Principal | ICD-10-CM

## 2019-09-01 MED ORDER — TOFACITINIB 11 MG PO TB24
11 mg | ORAL_TABLET | Freq: Every day | ORAL | 1 refills | 30.00000 days | Status: DC
Start: 2019-09-01 — End: 2019-09-01
  Filled 2019-09-01: qty 30, 30d supply

## 2019-09-01 MED ORDER — TOFACITINIB 11 MG PO TB24
11 mg | ORAL_TABLET | Freq: Every day | ORAL | 1 refills | 30.00000 days | Status: DC
Start: 2019-09-01 — End: 2019-10-30

## 2019-09-01 NOTE — Progress Notes
The Prior Authorization for Anna Thompson was approved for Anna Thompson from 06/03/19 to 08/31/20.  The copay is $0.  The [place] authorization number is [id number].    Anna Thompson has stated this copay is affordable.  The specialty pharmacy will pursue additional copay assistance as necessary.  The specialty pharmacy will reach out to the ambulatory clinical pharmacist if the copay becomes unaffordable.    The ambulatory pharmacist has been notified of the approval in order to provide education prior to dispense of the medication.  Will await notification from the pharmacist that it is OK to set up the fill per the patient's preferred delivery method.      Anna Thompson  Pharmacy Patient Advocate  [phone]

## 2019-09-01 NOTE — Progress Notes
The Prior Authorization for Harriette Ohara was submitted for [employer] via Cover My Meds.  Will continue to follow.    Wallene Dales  Pharmacy Patient Advocate  [phone]

## 2019-09-01 NOTE — Progress Notes
Obtained patient's verbal consent to treat them and their agreement to [facility] financial policy and NPP via this telehealth visit during the [facility] Emergency    Anna Thompson is a 49 year old Caucasian female here for  a follow-up visit    Chief complaint: Seronegative Sjogren's syndrome follow-up    Today:    Varas was last seen in October at which time she remained on methotrexate, low-dose naltrexone, and Lyrica, for management of Sjogren's syndrome and fibromyalgia, she was dealing with excessive sweating associated with the Evoxac use twice daily, while she reported improvement of her sicca symptoms on it, we decided to hold therapy for a few weeks and to see if this is related to the drug or its caused by some other etiology    In December she communicated with [place] reporting lack of improvement on methotrexate and ongoing joint pain along with active psoriatic lesions, in January we started her on Humira, methotrexate led to significant fatigue, but this continues to be a chronic problem for her which we discussed today      She started Humira in January and so far she does not report any improvement of any of her symptoms, continues to report inflammatory joint pain of the hands and knees with prolonged morning stiffness, she also describes 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, she tells me that this was diagnosed by her dermatologist but no specific therapy was prescribed  She has not been using Lyrica or low-dose naltrexone, the latter was prescribed for weight control according to the patient    There is no personal history of thrombosis     Last seen in July, we increased the dose of cevimeline to manage her sicca syndrome to 4 times per day, this resulted in worsening of baseline swelling she has been dealing with for about 5 years, she tells me she has drenching sweats that she changes her clothes at night several times, this pattern has not changed after she started Evoxac however, she decreased the dose to 2 tablets/day with significant improvement of her sicca symptoms, we gave her hydroxychloroquine for management of inflammatory arthritis but she had GI side effects and had to stop, she was seen in August by my colleague Trinda Pascal who switch her to subcutaneous methotrexate, 15 mg weekly, however, therapy was interrupted in September due to knee surgery, for approximately 2 weeks she had to stop therapy, she feels methotrexate has helped her joint pain but continues to be uncomfortable, she previously had aching moderately severe pain affecting her hands and feet associated with prolonged morning stiffness,  She also notes that methotrexate resulted in some increased level of fatigue for 2 days after each dose but this is tolerable  Importantly she has had 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 which completely resolved after she started methotrexate, she is pleased with that    No other complaints    Complete review of systems performed otherwise negative except for joint pain and post methotrexate fatigue, improved sicca syndrome and resolved psoriasis      Past Medical History:  Reviewed in O2    Past surgical History:  Reviewed in O2    Family History:  Family history reviewed, non-contributory      Social History:  Reviewed in O2      Allergies:   Reviewed in the records      Current medication list:  Reviewed        Five minute Schirmer's test without anesthesia:  OS: 7 mm   OD: 4 mm        Five minute Unstimulated salivary flow rate:    0.16  ml in 5 minutes =  0.032 ml/minute (very low)      Assessment and plan:    1.  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  Fibromyalgia syndrome,   Tried baclofen and hydrocodone. Flexeril and Pamelor led to suicidal thoughts. lyrica and naltrexone were not helpful     3.  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  Plaque psoriasis: Mainly affecting face, nearly resolved while on methotrexate,    5.  Long-term immunosuppression use    6.  Intolerance to hydroxychloroquine due to GI side effects.  Methotrexate used for about 6 months with improvement of the psoriasis but was not as effective for her joint pain    7.  Status post bariatric surgery    Plan;  1. Stop Humira  2. Start Xeljanz 11 mg daily  3. Continue evoxac twice daily    Return to the clinic in approximately 3 months    Leverne Humbles, MD    Because this dictation was prepared with voice recognition software, there remains a potential for typographical errors or incorrect word choices by the system. We apologize for any inadvertent inconvenience from such an error.

## 2019-09-02 ENCOUNTER — Encounter: Admit: 2019-09-02 | Discharge: 2019-09-02 | Payer: MEDICARE | Primary: Rheumatology

## 2019-09-02 NOTE — Telephone Encounter
Attempted to reach patient to discuss tofacitinib Anna Thompson) approval and schedule shipment. Left voicemail requesting a call back. Provided patient with pharmacist clinic phone number [phone]). If no return call from patient, will attempt again at a later date.

## 2019-09-02 NOTE — Telephone Encounter
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      Prescribed maintenance regimen: 11mg  po daily. Plan to continue indefinitely. The medication(s) have been appropriately dosed based on the patients renal and/or hepatic function.    At this time there are no planned titration/loading doses    The patient has the ability to self-administer the medication.     Therapeutic goals:reduce pain and improve quality of life    Patient fills at the [facility] pharmacy.    Baseline Characteristics   Level of disease activity: not assessed at last office visit   Current DMARDs:None   Previous DMARDs:HCQ, Humira and MTX   Flare in the last 30 days: No   Additional considerations/co-morbidities: none    TB Screening  05/08/19    Hepatitis B Screening  HBsAg   Date/Time Value Ref Range Status   03/14/2018 10:25 AM NEG NEG-NEG Final     Anti HBc Total   Date Value Ref Range Status   03/14/2018 NEG  Final     Anti HBs   Date/Time Value Ref Range Status   03/14/2018 10:25 AM <2.5 mIU/ml Final     Comment:                           Hepatitis B Surface Antibody Reference Ranges                        >12.0     Positive  8.0-12.0  Equivocal  <8.0      Negative          Other Pertinent Labs   Lab Results   Component Value Date    CHOL 188 10/06/2011    TRIG 108 10/06/2011    HDL 35 10/06/2011    LDL 137 (H) 10/06/2011    VLDL 22 10/06/2011    CHOLHDLC 5 10/06/2011        Hepatic Function    Lab Results   Component Value Date/Time    ALBUMIN 3.6 03/29/2016    TOTPROT 7.3 03/29/2016    ALKPHOS 85 03/29/2016    Lab Results   Component Value Date/Time    AST 15 03/29/2016    ALT 13 03/29/2016    TOTBILI 0.30 03/29/2016        CBC w diff    Lab Results   Component Value Date/Time    WBC 8.6 03/29/2016    RBC 4.54 03/29/2016    HGB 14.2 03/29/2016    HCT 42.4 03/29/2016    MCV 93.5 03/29/2016    MCH 31.4 (H) 03/29/2016    MCHC 33.6 03/29/2016    RDW 12.4 03/29/2016    PLTCT 282 03/29/2016    No results found for: NEUT, ANC, LYMA, ALC, MONA, AMC, EOSA, AEC, BASA, ABC       Basic Metabolic Profile    Lab Results   Component Value Date/Time    NA 138 04/19/2016    K 4.3 04/19/2016    CA 8.5 04/19/2016    CL 107 04/19/2016    CO2 23.0 04/19/2016    GAP 12 04/19/2016    Lab Results   Component Value Date/Time    BUN 17.0 04/19/2016    CR 0.84 04/19/2016    GLU 108 (H) 04/19/2016  Baseline labs have been collected and evaluated.     Allergies   Allergies   Allergen Reactions   ? Flexeril [Cyclobenzaprine] SEE COMMENTS     Suicidal Ideations   ? Baclofen EDEMA   ? Propranolol SEE COMMENTS     Bradycardia   ? Doxycycline VOMITING   ? Topamax [Topiramate] UNKNOWN        Vaccination Status    There is no immunization history on file for this patient.  Vaccine history will be reviewed with patient. Patient will be reminded about the importance of receiving an annual influenza vaccine as well as the pneumococcal and shingles vaccines if indicated.    Pregnancy Status  Pregnancy status was assessed and determined to be: Female, not of child-bearing potential: education not applicable.     Past Medical History  Medical History:   Diagnosis Date   ? Essential hypertension 05/09/2016   ? GERD (gastroesophageal reflux disease) 05/09/2016   ? Hyperlipemia 05/09/2016   ? Obesity 05/09/2016       Medication Reconciliation  Medication reconciliation is based on the patient?s most recent med list in electronic medical record (EMR) including herbal products and OTC medications. The patients? medication list will be updated during patient education, after speaking with the patient and prior to dispensing the medication.     Home Medications    Medication Sig   ARIPiprazole (ABILIFY) 5 mg tablet Take 5 mg by mouth daily.   buPROPion XL (WELLBUTRIN XL) 300 mg tablet Take 300 mg by mouth every morning. Do not crush or chew.   calcium carbonate/vitamin D-3 (OSCAL-500+D) 1250 mg/200 unit tablet Take 1 tablet by mouth daily. Calcium Carb 1250mg  delivers 500mg  elemental Ca   cevimeline (EVOXAC) 30 mg capsule Take one capsule by mouth four times daily. One tab 4 times daily   cyanocobalamin (VITAMIN B-12) 500 mcg tablet Take 500 mcg by mouth daily.   cycloSPORINE (RESTASIS) 0.05 % ophthalmic emulsion Apply 1 drop to both eyes twice daily.   ferrous sulfate (FEOSOL) 325 mg (65 mg iron) tablet Take 325 mg by mouth daily. Take on an empty stomach at least 1 hour before or 2 hours after food.   hydrOXYchloroQUINE (PLAQUENIL) 200 mg tablet Take with food. Once daily for 2 weeks therm twice daily   Insulin Syringe-Needle U-100 (BD INSULIN SYRINGE) 1 mL 25 gauge x 5/8 syrg Use with Methotrexate injections   methotrexate PF 25 mg/mL injection Inject 0.6 mL under the skin every 7 days. Discard vial after use.   methylPREDNIsolone (MEDROL DOSEPAK) 4 mg tablet Take medication as directed on package for 6 days. Take with food.   montelukast (SINGULAIR) 10 mg tablet Take 10 mg by mouth at bedtime daily.   MULTIVITAMIN PO Take  by mouth.   naltrexone (DEPADE) 50 mg tablet Take 25 mg by mouth twice daily.   omeprazole DR(+) (PRILOSEC) 40 mg capsule Take 40 mg by mouth twice daily.   ondansetron (ZOFRAN) 4 mg tablet Take 4 mg by mouth every 8 hours as needed for Nausea or Vomiting.   other medication 1 Dose. botox injections   phytonadione (VITAMIN K) 5 mg tablet Take 5 mg by mouth once.   pregabalin (LYRICA) 150 mg capsule Take 75 mg by mouth twice daily.   promethazine (PHENERGAN) 25 mg tablet Take 25 mg by mouth every 6 hours as needed for Nausea or Vomiting.   tofacitinib (XELJANZ XR) 11 mg tablet Take one tablet by mouth daily.   VIIBRYD 40 mg  tab Take 1 tablet by mouth daily.   vitamins, multi B, C, Zn & folate (Renal) (NEPHPLEX RX) 1-60-300-12.5 mg-mg-mcg-mg tab Take 1 tablet by mouth daily.       Drug-Drug Interactions  No new significant drug-drug or drug-food interactions were identified.    Drug-Food Interactions  There are no significant drug-food interactions. This medication can be taken with or without food.     Contraindications  ZAEDA MCFERRAN has no contraindications to this medication.    Safety Precautions  Safety precautions were evaluated and discussed with patient as applicable.     Risk Evaluation and Mitigation Strategy (REMS) Assessment  No REMS are required for any of the prescribed medications for this patient.      Medication Education  Patient will be contacted to complete education on therapy.      Follow-up Plan   -Initial therapy assessment has been completed  -Patient will be reassessed within 1 month.      Marni Griffon, PHARMD

## 2019-09-03 ENCOUNTER — Encounter: Admit: 2019-09-03 | Discharge: 2019-09-03 | Payer: MEDICARE | Primary: Rheumatology

## 2019-09-03 MED FILL — TOFACITINIB 11 MG PO TB24: 11 mg | ORAL | 30 days supply | Qty: 30 | Fill #1 | Status: AC

## 2019-09-18 ENCOUNTER — Encounter: Admit: 2019-09-18 | Discharge: 2019-09-18 | Payer: MEDICARE | Primary: Rheumatology

## 2019-09-19 MED ORDER — CEVIMELINE 30 MG PO CAP
30 mg | ORAL_CAPSULE | Freq: Two times a day (BID) | ORAL | 1 refills | Status: AC
Start: 2019-09-19 — End: ?

## 2019-09-26 ENCOUNTER — Encounter: Admit: 2019-09-26 | Discharge: 2019-09-26 | Payer: MEDICARE | Primary: Rheumatology

## 2019-09-26 NOTE — Progress Notes
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     The regimen of 11mg  po daily is planned to continue indefinitely which is appropriate for Anna Thompson.     The medication(s) have been appropriately dosed based on the patients renal and/or hepatic function. At this time the there is no planned dose titration.    Patient fills at the [facility] pharmacy.    Response to Therapy  Patient Assessments  Therapeutic goals: reduce pain and improve quality of life  Noticeable benefit to patient towards goals: Yes  Subjective clinical assessment: on a scale of 0-100%, the patient reports a 50-74% improvement  in symptoms since starting therapy     Disease Activity  Level of disease activity at last office visit: not assessed at last office visit   Flare in the last 30 days: No   Current DMARDs: Anna Thompson   Previous DMARDs: HCQ, Humira and MTX   Additional considerations/co-morbidities: none    As the patient is achieving therapeutic benefit from this therapy and the plan is to continue.     Adverse Effects  Anna Thompson is not experiencing any significant adverse effects to this medication regimen. Injection concerns: N/A    Adherence  Refill and adherence history were reviewed with the patient. The patient is adherent with refills and is meeting their refill goal. They report no missed doses over the past 30 days.  The patient is meeting their adherence goal. The patient was reminded about the refill process and re-educated on the importance of adherence.    Medication Reconciliation  A medication history and reconciliation were performed (including prescription medications, supplements, over the counter, and herbal products). The medication list was updated and the patients? current medication list is included below.     Home Medications    Medication Sig   ARIPiprazole (ABILIFY) 5 mg tablet Take 5 mg by mouth daily.   buPROPion XL (WELLBUTRIN XL) 300 mg tablet Take 300 mg by mouth every morning. Do not crush or chew.   calcium carbonate/vitamin D-3 (OSCAL-500+D) 1250 mg/200 unit tablet Take 1 tablet by mouth daily. Calcium Carb 1250mg  delivers 500mg  elemental Ca   cevimeline (EVOXAC) 30 mg capsule Take one capsule by mouth twice daily.   cyanocobalamin (VITAMIN B-12) 500 mcg tablet Take 500 mcg by mouth daily.   cycloSPORINE (RESTASIS) 0.05 % ophthalmic emulsion Apply 1 drop to both eyes twice daily.   ferrous sulfate (FEOSOL) 325 mg (65 mg iron) tablet Take 325 mg by mouth daily. Take on an empty stomach at least 1 hour before or 2 hours after food.   hydrOXYchloroQUINE (PLAQUENIL) 200 mg tablet Take with food. Once daily for 2 weeks therm twice daily   Insulin Syringe-Needle U-100 (BD INSULIN SYRINGE) 1 mL 25 gauge x 5/8 syrg Use with Methotrexate injections   methotrexate PF 25 mg/mL injection Inject 0.6 mL under the skin every 7 days. Discard vial after use.   methylPREDNIsolone (MEDROL DOSEPAK) 4 mg tablet Take medication as directed on package for 6 days. Take with food.   montelukast (SINGULAIR) 10 mg tablet Take 10 mg by mouth at bedtime daily.   MULTIVITAMIN PO Take  by mouth.   naltrexone (DEPADE) 50 mg tablet Take 25 mg by mouth twice daily.   omeprazole DR(+) (PRILOSEC) 40 mg capsule Take 40 mg by mouth twice daily.   ondansetron (ZOFRAN) 4  mg tablet Take 4 mg by mouth every 8 hours as needed for Nausea or Vomiting.   other medication 1 Dose. botox injections   phytonadione (VITAMIN K) 5 mg tablet Take 5 mg by mouth once.   pregabalin (LYRICA) 150 mg capsule Take 75 mg by mouth twice daily.   promethazine (PHENERGAN) 25 mg tablet Take 25 mg by mouth every 6 hours as needed for Nausea or Vomiting.   tofacitinib (XELJANZ XR) 11 mg tablet Take one tablet by mouth daily.   VIIBRYD 40 mg tab Take 1 tablet by mouth daily.   vitamins, multi B, C, Zn & folate (Renal) (NEPHPLEX RX) 1-60-300-12.5 mg-mg-mcg-mg tab Take 1 tablet by mouth daily.        Drug-drug and drug-food interactions between the patients? specialty medication and their medication list were assessed and reviewed with the patient. The patient was instructed to speak with their health care provider before starting any new drug, including prescription or over the counter, natural / herbal products, or vitamins.    No new significant drug-drug or drug-food interactions were identified.    Their regimen can be taken with or without food.    Allergies   Allergies   Allergen Reactions   ? Flexeril [Cyclobenzaprine] SEE COMMENTS     Suicidal Ideations   ? Baclofen EDEMA   ? Propranolol SEE COMMENTS     Bradycardia   ? Doxycycline VOMITING   ? Topamax [Topiramate] UNKNOWN        Vaccination Status    There is no immunization history on file for this patient.  Vaccine history reviewed with patient. Patient reminded about the importance of receiving an annual influenza vaccine as well as the pneumococcal and shingles vaccines if indicated.      TB Screening  05/08/19    Hepatitis B Screening  HBsAg   Date/Time Value Ref Range Status   03/14/2018 10:25 AM NEG NEG-NEG Final     Anti HBc Total   Date Value Ref Range Status   03/14/2018 NEG  Final     Anti HBs   Date/Time Value Ref Range Status   03/14/2018 10:25 AM <2.5 mIU/ml Final     Comment:                           Hepatitis B Surface Antibody Reference Ranges                        >12.0     Positive  8.0-12.0  Equivocal  <8.0      Negative          Other Pertinent Labs   Lab Results   Component Value Date    CHOL 188 10/06/2011    TRIG 108 10/06/2011    HDL 35 10/06/2011    LDL 137 (H) 10/06/2011    VLDL 22 10/06/2011    CHOLHDLC 5 10/06/2011        Hepatic Function    Lab Results   Component Value Date/Time    ALBUMIN 3.6 03/29/2016 12:00 AM    TOTPROT 7.3 03/29/2016 12:00 AM    ALKPHOS 85 03/29/2016 12:00 AM    Lab Results   Component Value Date/Time    AST 15 03/29/2016 12:00 AM    ALT 13 03/29/2016 12:00 AM    TOTBILI 0.30 03/29/2016 12:00 AM        CBC w diff    Lab Results  Component Value Date/Time    WBC 8.6 03/29/2016 12:00 AM    RBC 4.54 03/29/2016 12:00 AM    HGB 14.2 03/29/2016 12:00 AM    HCT 42.4 03/29/2016 12:00 AM    MCV 93.5 03/29/2016 12:00 AM    MCH 31.4 (H) 03/29/2016 12:00 AM    MCHC 33.6 03/29/2016 12:00 AM    RDW 12.4 03/29/2016 12:00 AM    PLTCT 282 03/29/2016 12:00 AM    No results found for: NEUT, ANC, LYMA, ALC, MONA, AMC, EOSA, AEC, BASA, ABC       Basic Metabolic Profile    Lab Results   Component Value Date/Time    NA 138 04/19/2016 12:00 AM    K 4.3 04/19/2016 12:00 AM    CA 8.5 04/19/2016 12:00 AM    CL 107 04/19/2016 12:00 AM    CO2 23.0 04/19/2016 12:00 AM    GAP 12 04/19/2016 12:00 AM    Lab Results   Component Value Date/Time    BUN 17.0 04/19/2016 12:00 AM    CR 0.84 04/19/2016 12:00 AM    GLU 108 (H) 04/19/2016 12:00 AM             Pregnancy Status  Pregnancy status was assessed and determined to be: Female, not of child-bearing potential: education not applicable.     Risk Evaluation and Mitigation Strategy (REMS) Assessment  No REMS are required for any of the prescribed medications for this patient.     Additional Discussions  None    Follow-up Plan   Labs due 1 month after starting therapy. Patient aware.    Anna Thompson was given the opportunity to ask questions and did not have any questions at this time. The patient was encouraged to call with questions. The patient will be contacted to complete another reassessment within 1 year.      Marni Griffon, PHARMD

## 2019-09-30 ENCOUNTER — Encounter: Admit: 2019-09-30 | Discharge: 2019-09-30 | Payer: MEDICARE | Primary: Rheumatology

## 2019-09-30 MED FILL — TOFACITINIB 11 MG PO TB24: 11 mg | ORAL | 30 days supply | Qty: 30 | Fill #2 | Status: AC

## 2019-10-22 ENCOUNTER — Encounter: Admit: 2019-10-22 | Discharge: 2019-10-22 | Payer: MEDICARE | Primary: Rheumatology

## 2019-10-24 ENCOUNTER — Encounter: Admit: 2019-10-24 | Discharge: 2019-10-24 | Payer: MEDICARE | Primary: Rheumatology

## 2019-10-27 ENCOUNTER — Encounter: Admit: 2019-10-27 | Discharge: 2019-10-27 | Payer: MEDICARE | Primary: Rheumatology

## 2019-10-28 ENCOUNTER — Encounter: Admit: 2019-10-28 | Discharge: 2019-10-28 | Payer: MEDICARE | Primary: Rheumatology

## 2019-10-29 ENCOUNTER — Encounter: Admit: 2019-10-29 | Discharge: 2019-10-29 | Payer: MEDICARE | Primary: Rheumatology

## 2019-10-29 DIAGNOSIS — Z79899 Other long term (current) drug therapy: Secondary | ICD-10-CM

## 2019-10-30 ENCOUNTER — Encounter: Admit: 2019-10-30 | Discharge: 2019-10-30 | Payer: MEDICARE | Primary: Rheumatology

## 2019-10-30 MED ORDER — XELJANZ XR 11 MG PO TB24
11 mg | ORAL_TABLET | Freq: Every day | ORAL | 0 refills | 30.00000 days | Status: AC
Start: 2019-10-30 — End: ?
  Filled 2019-10-31: qty 30, 30d supply, fill #1

## 2019-10-31 ENCOUNTER — Encounter: Admit: 2019-10-31 | Discharge: 2019-10-31 | Payer: MEDICARE | Primary: Rheumatology

## 2019-11-05 ENCOUNTER — Encounter: Admit: 2019-11-05 | Discharge: 2019-11-05 | Payer: MEDICARE | Primary: Rheumatology

## 2019-11-25 ENCOUNTER — Encounter: Admit: 2019-11-25 | Discharge: 2019-11-25 | Payer: MEDICARE | Primary: Rheumatology

## 2019-11-25 MED FILL — XELJANZ XR 11 MG PO TB24: 11 mg | ORAL | 30 days supply | Qty: 90 | Fill #2 | Status: AC

## 2019-12-23 ENCOUNTER — Encounter: Admit: 2019-12-23 | Discharge: 2019-12-23 | Payer: MEDICARE | Primary: Rheumatology

## 2019-12-23 MED FILL — XELJANZ XR 11 MG PO TB24: 11 mg | ORAL | 30 days supply | Qty: 30 | Fill #3 | Status: AC

## 2020-01-14 ENCOUNTER — Encounter: Admit: 2020-01-14 | Discharge: 2020-01-14 | Payer: MEDICARE | Primary: Rheumatology

## 2020-01-14 MED ORDER — XELJANZ XR 11 MG PO TB24
11 mg | ORAL_TABLET | Freq: Every day | ORAL | 0 refills
Start: 2020-01-14 — End: ?

## 2020-01-15 ENCOUNTER — Encounter: Admit: 2020-01-15 | Discharge: 2020-01-15 | Payer: MEDICARE | Primary: Rheumatology

## 2020-01-16 ENCOUNTER — Encounter: Admit: 2020-01-16 | Discharge: 2020-01-16 | Payer: MEDICARE | Primary: Rheumatology

## 2020-01-21 ENCOUNTER — Encounter: Admit: 2020-01-21 | Discharge: 2020-01-21 | Payer: MEDICARE | Primary: Rheumatology

## 2020-01-21 DIAGNOSIS — Z79899 Other long term (current) drug therapy: Secondary | ICD-10-CM

## 2020-01-21 DIAGNOSIS — R7989 Other specified abnormal findings of blood chemistry: Secondary | ICD-10-CM

## 2020-01-22 ENCOUNTER — Encounter: Admit: 2020-01-22 | Discharge: 2020-01-22 | Payer: MEDICARE | Primary: Rheumatology

## 2020-01-22 MED FILL — XELJANZ XR 11 MG PO TB24: 11 mg | ORAL | 30 days supply | Qty: 30 | Fill #1 | Status: AC

## 2020-02-02 ENCOUNTER — Ambulatory Visit: Admit: 2020-02-02 | Discharge: 2020-02-03 | Payer: MEDICARE | Primary: Rheumatology

## 2020-02-02 ENCOUNTER — Encounter: Admit: 2020-02-02 | Discharge: 2020-02-02 | Payer: MEDICARE | Primary: Rheumatology

## 2020-02-02 DIAGNOSIS — R7401 Elevated alanine aminotransferase (ALT) level: Secondary | ICD-10-CM

## 2020-02-02 DIAGNOSIS — E785 Hyperlipidemia, unspecified: Secondary | ICD-10-CM

## 2020-02-02 DIAGNOSIS — R7989 Other specified abnormal findings of blood chemistry: Secondary | ICD-10-CM

## 2020-02-02 DIAGNOSIS — I10 Essential (primary) hypertension: Secondary | ICD-10-CM

## 2020-02-02 DIAGNOSIS — N179 Acute kidney failure, unspecified: Secondary | ICD-10-CM

## 2020-02-02 DIAGNOSIS — K219 Gastro-esophageal reflux disease without esophagitis: Secondary | ICD-10-CM

## 2020-02-02 DIAGNOSIS — E669 Obesity, unspecified: Secondary | ICD-10-CM

## 2020-02-02 LAB — CBC AND DIFF
Lab: 0 10*3/uL (ref 0–0.20)
Lab: 0.1 10*3/uL (ref 0–0.45)
Lab: 0.4 10*3/uL (ref 0–0.80)
Lab: 1 % — ABNORMAL LOW (ref >60)
Lab: 1 % — ABNORMAL LOW (ref >60)
Lab: 12 % (ref 11–15)
Lab: 13 g/dL (ref 12.0–15.0)
Lab: 2.2 10*3/uL (ref 1.0–4.8)
Lab: 27 % (ref 24–44)
Lab: 284 K/UL (ref 150–400)
Lab: 31 pg (ref 26–34)
Lab: 34 g/dL — ABNORMAL LOW (ref 32.0–36.0)
Lab: 38 % — ABNORMAL HIGH (ref >60)
Lab: 4.1 M/UL — ABNORMAL LOW (ref 4.0–5.0)
Lab: 5.4 10*3/uL (ref 1.8–7.0)
Lab: 6 % (ref 4–12)
Lab: 65 % (ref 41–77)
Lab: 8.3 K/UL — ABNORMAL HIGH (ref 4.5–11.0)
Lab: 8.7 FL (ref 7–11)
Lab: 92 FL (ref >60)

## 2020-02-02 LAB — COMPREHENSIVE METABOLIC PANEL
Lab: 136 MMOL/L — ABNORMAL LOW (ref 137–147)
Lab: 4.6 MMOL/L (ref 3.5–5.1)

## 2020-02-02 NOTE — Progress Notes
Anna Thompson is a 49 year old Caucasian female here for  a follow-up visit    Chief complaint: Seronegative Sjogren's syndrome follow-up      Interval history  Last seen in May at which time she was switched from adalimumab to [place], labs in June were unremarkable however soon after she started therapy, she was noted to have mild elevation of her LFTs, there is no history of alcohol use, or history of chronic liver disease, she tells me that she recently received the COVID-19 booster dose, and feels well in general, she does report improvement of her musculoskeletal pain without side effects, she also tells me that [place] cleared up her psoriasis completely, no recent infections  She has no new complaints    Past Medical History:  Reviewed in O2    Past surgical History:  Reviewed in O2    Family History:  Family history reviewed, non-contributory      Social History:  Reviewed in O2      Allergies:   Reviewed in the records      Current medication list:  Reviewed      Physical exam    BP 114/81 (BP Source: Arm, Right Upper, Patient Position: Sitting)  - Pulse 88  - Temp 36.8 ?C (98.2 ?F) (Temporal)  - Resp 16  - Ht 154.9 cm (61)  - Wt 87.1 kg (192 lb)  - SpO2 98%  - BMI 36.28 kg/m?      Musculoskeletal exam, there is no evidence of synovitis on examination of upper extremities, mild tenderness without swelling involving the knees is noted    Five minute Schirmer's test without anesthesia:    OS: 7 mm   OD: 4 mm        Five minute Unstimulated salivary flow rate:    0.16  ml in 5 minutes =  0.032 ml/minute (very low)      Assessment and plan:    1.  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  Fibromyalgia syndrome,   Tried baclofen and hydrocodone. Flexeril and Pamelor led to suicidal thoughts. lyrica and naltrexone were not helpful     3.  Plaque psoriasis: Mainly affecting face, resolved after she started [place]    4.  Long-term immunosuppression use  5.  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  Intolerance to hydroxychloroquine due to GI side effects.  Methotrexate used for about 6 months with improvement of the psoriasis but was not as effective for her joint pain  7.  Status post bariatric surgery    Plan;  1.  Continue Xeljanz 11 mg daily  2.  Repeat LFT today  3. Continue evoxac twice daily    Return to the clinic in approximately 3 months    Leverne Humbles, MD    Because this dictation was prepared with voice recognition software, there remains a potential for typographical errors or incorrect word choices by the system. We apologize for any inadvertent inconvenience from such an error.

## 2020-02-02 NOTE — Patient Instructions
Labs today    Labs one month later then monthly    Continue Harriette Ohara and [employer]

## 2020-02-03 ENCOUNTER — Encounter: Admit: 2020-02-03 | Discharge: 2020-02-03 | Payer: MEDICARE | Primary: Rheumatology

## 2020-02-03 DIAGNOSIS — R7401 Elevation of levels of liver transaminase levels: Secondary | ICD-10-CM

## 2020-02-09 ENCOUNTER — Encounter: Admit: 2020-02-09 | Discharge: 2020-02-09 | Payer: MEDICARE | Primary: Rheumatology

## 2020-02-16 ENCOUNTER — Encounter: Admit: 2020-02-16 | Discharge: 2020-02-16 | Payer: MEDICARE | Primary: Rheumatology

## 2020-02-16 MED ORDER — XELJANZ XR 11 MG PO TB24
11 mg | ORAL_TABLET | Freq: Every day | ORAL | 0 refills | Status: CN
Start: 2020-02-16 — End: ?

## 2020-02-17 ENCOUNTER — Encounter: Admit: 2020-02-17 | Discharge: 2020-02-17 | Payer: MEDICARE | Primary: Rheumatology

## 2020-02-18 ENCOUNTER — Encounter: Admit: 2020-02-18 | Discharge: 2020-02-18 | Payer: MEDICARE | Primary: Rheumatology

## 2020-02-18 MED FILL — XELJANZ XR 11 MG PO TB24: 11 mg | ORAL | 30 days supply | Qty: 90 | Fill #1 | Status: AC

## 2020-02-19 ENCOUNTER — Encounter: Admit: 2020-02-19 | Discharge: 2020-02-19 | Payer: MEDICARE | Primary: Rheumatology

## 2020-02-19 DIAGNOSIS — N179 Acute kidney failure, unspecified: Secondary | ICD-10-CM

## 2020-02-19 LAB — BASIC METABOLIC PANEL

## 2020-02-19 LAB — PROTEIN/CR RATIO,UR RAN

## 2020-03-16 ENCOUNTER — Encounter: Admit: 2020-03-16 | Discharge: 2020-03-16 | Payer: MEDICARE | Primary: Rheumatology

## 2020-03-16 MED FILL — XELJANZ XR 11 MG PO TB24: 11 mg | ORAL | 30 days supply | Qty: 30 | Fill #2 | Status: AC

## 2020-03-22 ENCOUNTER — Encounter: Admit: 2020-03-22 | Discharge: 2020-03-22 | Payer: MEDICARE | Primary: Rheumatology

## 2020-03-23 ENCOUNTER — Encounter: Admit: 2020-03-23 | Discharge: 2020-03-23 | Payer: MEDICARE | Primary: Rheumatology

## 2020-04-01 ENCOUNTER — Encounter: Admit: 2020-04-01 | Discharge: 2020-04-01 | Payer: MEDICARE | Primary: Rheumatology

## 2020-04-16 ENCOUNTER — Encounter: Admit: 2020-04-16 | Discharge: 2020-04-16 | Payer: MEDICARE

## 2020-04-16 MED FILL — XELJANZ XR 11 MG PO TB24: 11 mg | ORAL | 30 days supply | Qty: 30 | Fill #3 | Status: AC

## 2020-04-25 ENCOUNTER — Encounter: Admit: 2020-04-25 | Discharge: 2020-04-25 | Payer: MEDICARE

## 2020-05-04 ENCOUNTER — Encounter: Admit: 2020-05-04 | Discharge: 2020-05-04 | Payer: MEDICARE

## 2020-05-05 ENCOUNTER — Encounter: Admit: 2020-05-05 | Discharge: 2020-05-05 | Payer: MEDICARE

## 2020-05-14 ENCOUNTER — Encounter: Admit: 2020-05-14 | Discharge: 2020-05-14 | Payer: MEDICARE

## 2020-05-14 MED ORDER — XELJANZ XR 11 MG PO TB24
11 mg | ORAL_TABLET | Freq: Every day | ORAL | 0 refills
Start: 2020-05-14 — End: ?

## 2020-05-19 ENCOUNTER — Encounter: Admit: 2020-05-19 | Discharge: 2020-05-19 | Payer: MEDICARE

## 2020-05-19 DIAGNOSIS — R7989 Other specified abnormal findings of blood chemistry: Secondary | ICD-10-CM

## 2020-05-20 ENCOUNTER — Encounter: Admit: 2020-05-20 | Discharge: 2020-05-20 | Payer: MEDICARE

## 2020-05-26 ENCOUNTER — Encounter: Admit: 2020-05-26 | Discharge: 2020-05-26 | Payer: MEDICARE

## 2020-05-31 ENCOUNTER — Encounter: Admit: 2020-05-31 | Discharge: 2020-05-31 | Payer: MEDICARE

## 2020-05-31 MED FILL — XELJANZ XR 11 MG PO TB24: 11 mg | ORAL | 30 days supply | Qty: 30 | Fill #1 | Status: AC

## 2020-06-22 ENCOUNTER — Encounter: Admit: 2020-06-22 | Discharge: 2020-06-22 | Payer: MEDICARE

## 2020-06-22 DIAGNOSIS — E7800 Pure hypercholesterolemia, unspecified: Secondary | ICD-10-CM

## 2020-06-22 DIAGNOSIS — Z01810 Encounter for preprocedural cardiovascular examination: Secondary | ICD-10-CM

## 2020-06-22 DIAGNOSIS — E669 Obesity, unspecified: Secondary | ICD-10-CM

## 2020-06-22 DIAGNOSIS — M3500 Sicca syndrome, unspecified: Secondary | ICD-10-CM

## 2020-06-22 DIAGNOSIS — E785 Hyperlipidemia, unspecified: Secondary | ICD-10-CM

## 2020-06-22 DIAGNOSIS — K219 Gastro-esophageal reflux disease without esophagitis: Secondary | ICD-10-CM

## 2020-06-22 DIAGNOSIS — I10 Essential (primary) hypertension: Secondary | ICD-10-CM

## 2020-06-22 NOTE — Patient Instructions
Thank you for visiting our office today.    Continue the same medications as you have been doing.          We will be pursuing the following tests after your appointment today:       Orders Placed This Encounter    ECG Today (all locations)    2D + DOPPLER ECHO          Please call us in the meantime with any questions or concerns.        Please allow 5-7 business days for our providers to review your results. All normal results will go to MyChart. If you do not have Mychart, it is strongly recommended to get this so you can easily view all your results. If you do not have mychart, we will attempt to call you once with normal lab and testing results. If we cannot reach you by phone with normal results, we will send you a letter.  If you have not heard the results of your testing after one week please give [place] a call.       Your Cardiovascular Medicine [place] Team Brett Canales, Pilar Jarvis and Kimball)  phone number is [phone].
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
## 2020-06-29 ENCOUNTER — Encounter: Admit: 2020-06-29 | Discharge: 2020-06-29 | Payer: MEDICARE

## 2020-06-29 ENCOUNTER — Ambulatory Visit: Admit: 2020-06-29 | Discharge: 2020-06-29 | Payer: MEDICARE

## 2020-06-29 DIAGNOSIS — I10 Essential (primary) hypertension: Secondary | ICD-10-CM

## 2020-07-01 ENCOUNTER — Encounter: Admit: 2020-07-01 | Discharge: 2020-07-01 | Payer: MEDICARE

## 2020-07-01 NOTE — Telephone Encounter
Left voicemail with results and recommendations. Left callback for any questions, comments, or concerns.

## 2020-07-01 NOTE — Telephone Encounter
-----   Message from Dorris Fetch, MD sent at 07/01/2020  1:00 PM CST -----  Please call the patient and let her know that the echocardiogram showed normal heart function, there are no abnormalities.Thank you  ----- Message -----  From: Willette Pa, MD  Sent: 06/30/2020  10:09 AM CST  To: Dorris Fetch, MD

## 2020-07-20 ENCOUNTER — Encounter: Admit: 2020-07-20 | Discharge: 2020-07-20 | Payer: MEDICARE

## 2020-07-24 ENCOUNTER — Encounter: Admit: 2020-07-24 | Discharge: 2020-07-24 | Payer: MEDICARE

## 2020-08-12 ENCOUNTER — Encounter: Admit: 2020-08-12 | Discharge: 2020-08-12 | Payer: MEDICARE

## 2020-08-12 NOTE — Progress Notes
Cardiac clearance/office visit from 06/22/20 faxed to Dr Fleeta Emmer office at [phone]. Patient will callback if that office does not receive.

## 2020-08-25 ENCOUNTER — Encounter: Admit: 2020-08-25 | Discharge: 2020-08-25 | Payer: MEDICARE

## 2020-08-25 NOTE — Progress Notes
Patient is no longer taking the specialty medication Harriette Ohara as patient did not have worsening of symptoms/feel different when she held the medication due to an infection. The physician/APP is aware. Thus, the patient will be removed from the specialty pharmacy patient management program.     The medication has been removed from the patient's active medication list.  Should the patient's care team wish to restart the medication, a new prescription will need to be sent to [facility].

## 2021-02-09 ENCOUNTER — Encounter: Admit: 2021-02-09 | Discharge: 2021-02-09 | Payer: MEDICARE

## 2021-04-05 ENCOUNTER — Encounter: Admit: 2021-04-05 | Discharge: 2021-04-05 | Payer: MEDICARE

## 2021-06-15 ENCOUNTER — Encounter: Admit: 2021-06-15 | Discharge: 2021-06-15 | Payer: MEDICARE

## 2022-07-27 ENCOUNTER — Encounter: Admit: 2022-07-27 | Discharge: 2022-07-27 | Payer: MEDICARE

## 2024-04-29 IMAGING — CR [ID]
5 series · 5 of 5 positions shown · non-contrast
Comparison: none

[t lumbar spine ap]
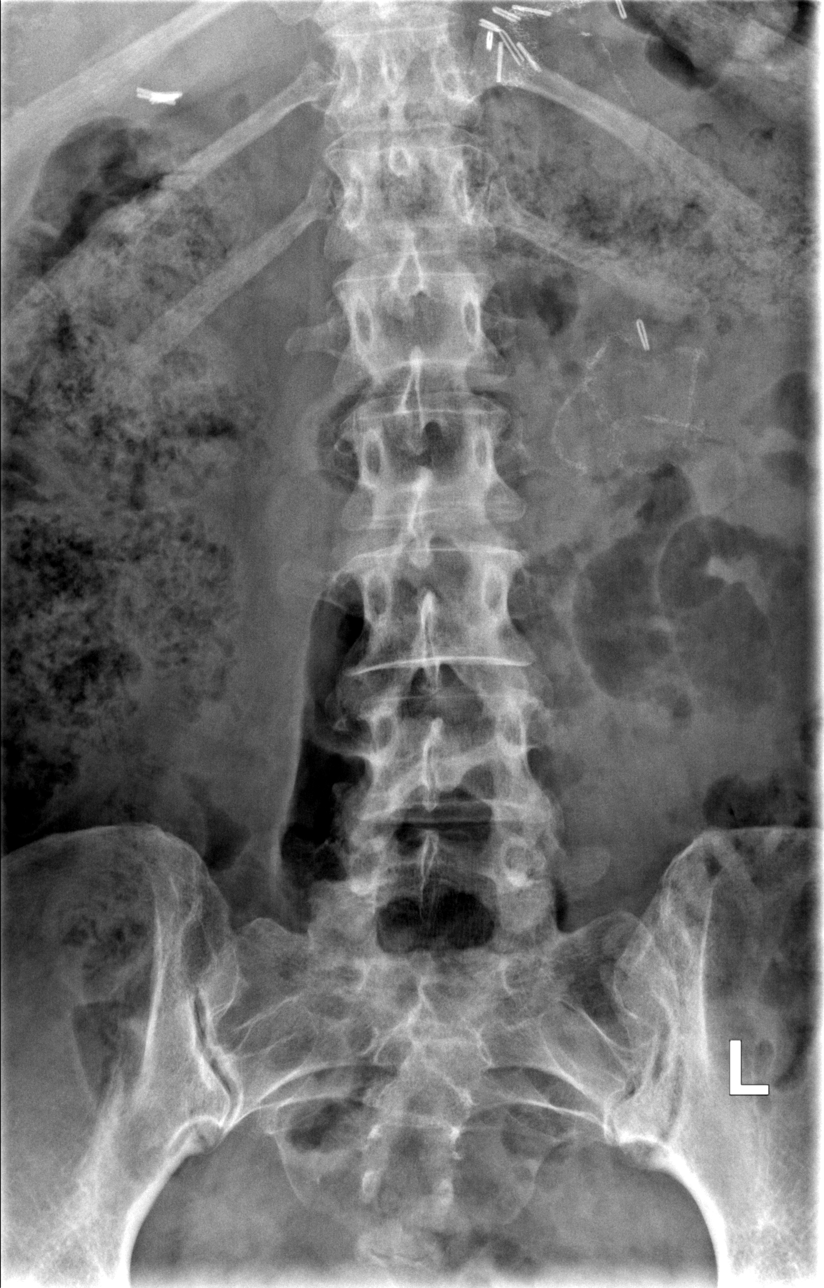

[t lumbar spine obl (1 of 2)]
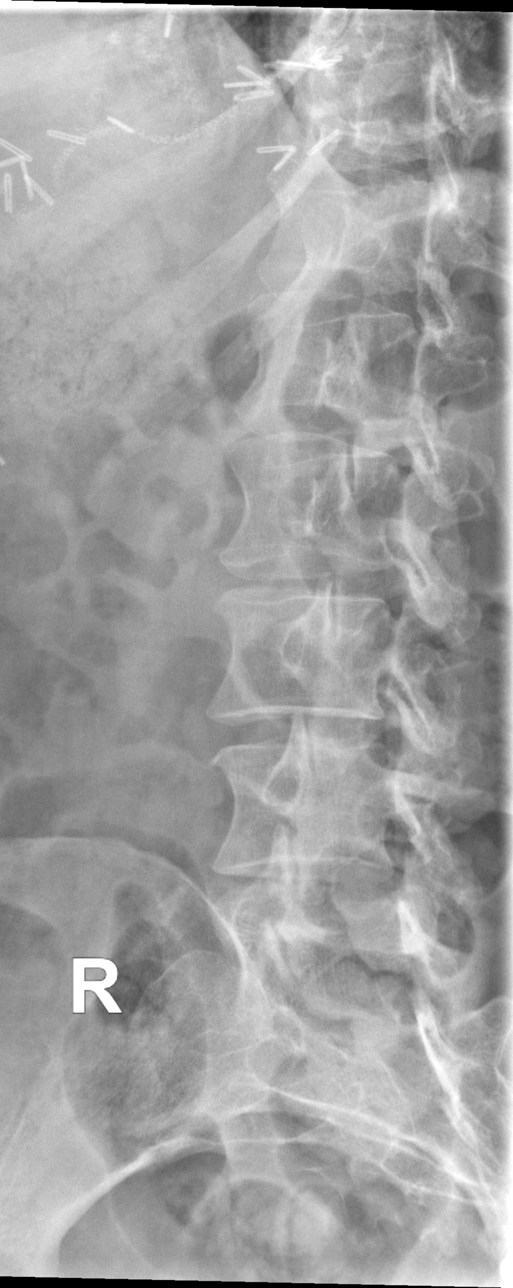

[t lumbar spine obl (2 of 2)]
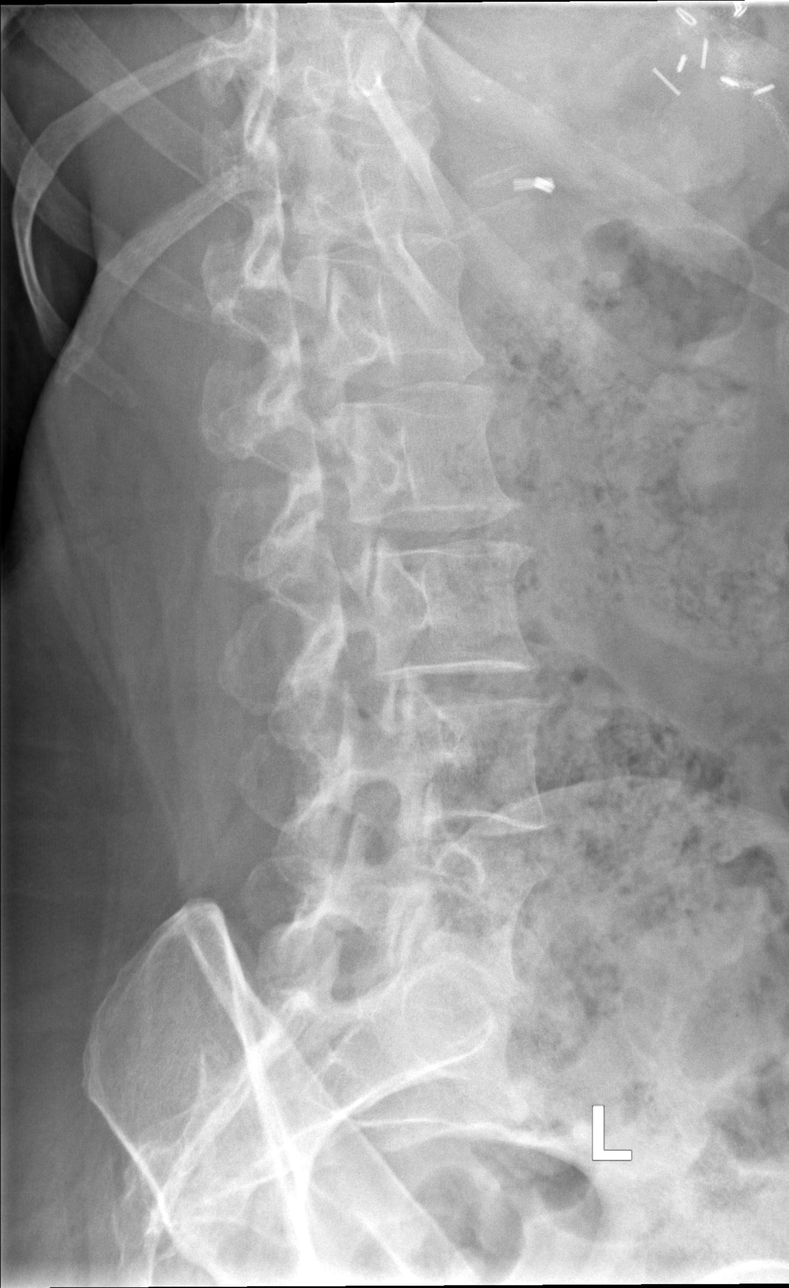

[t lumbar spine lat]
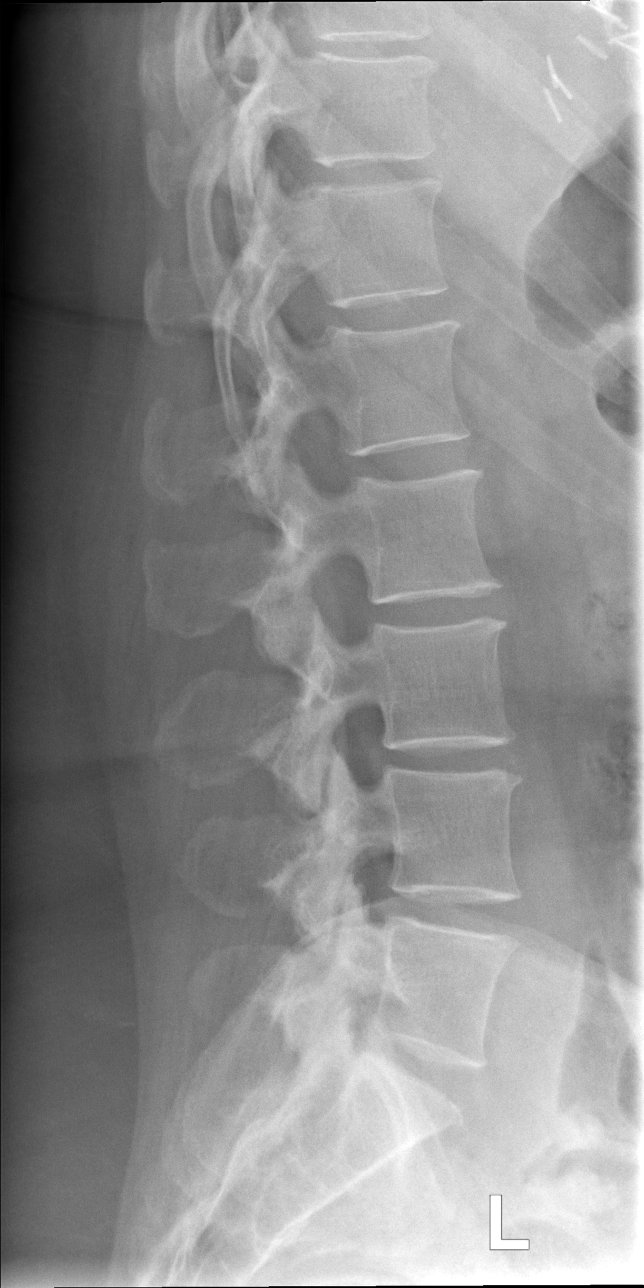

[t lumbar l-5 s-1 spot]
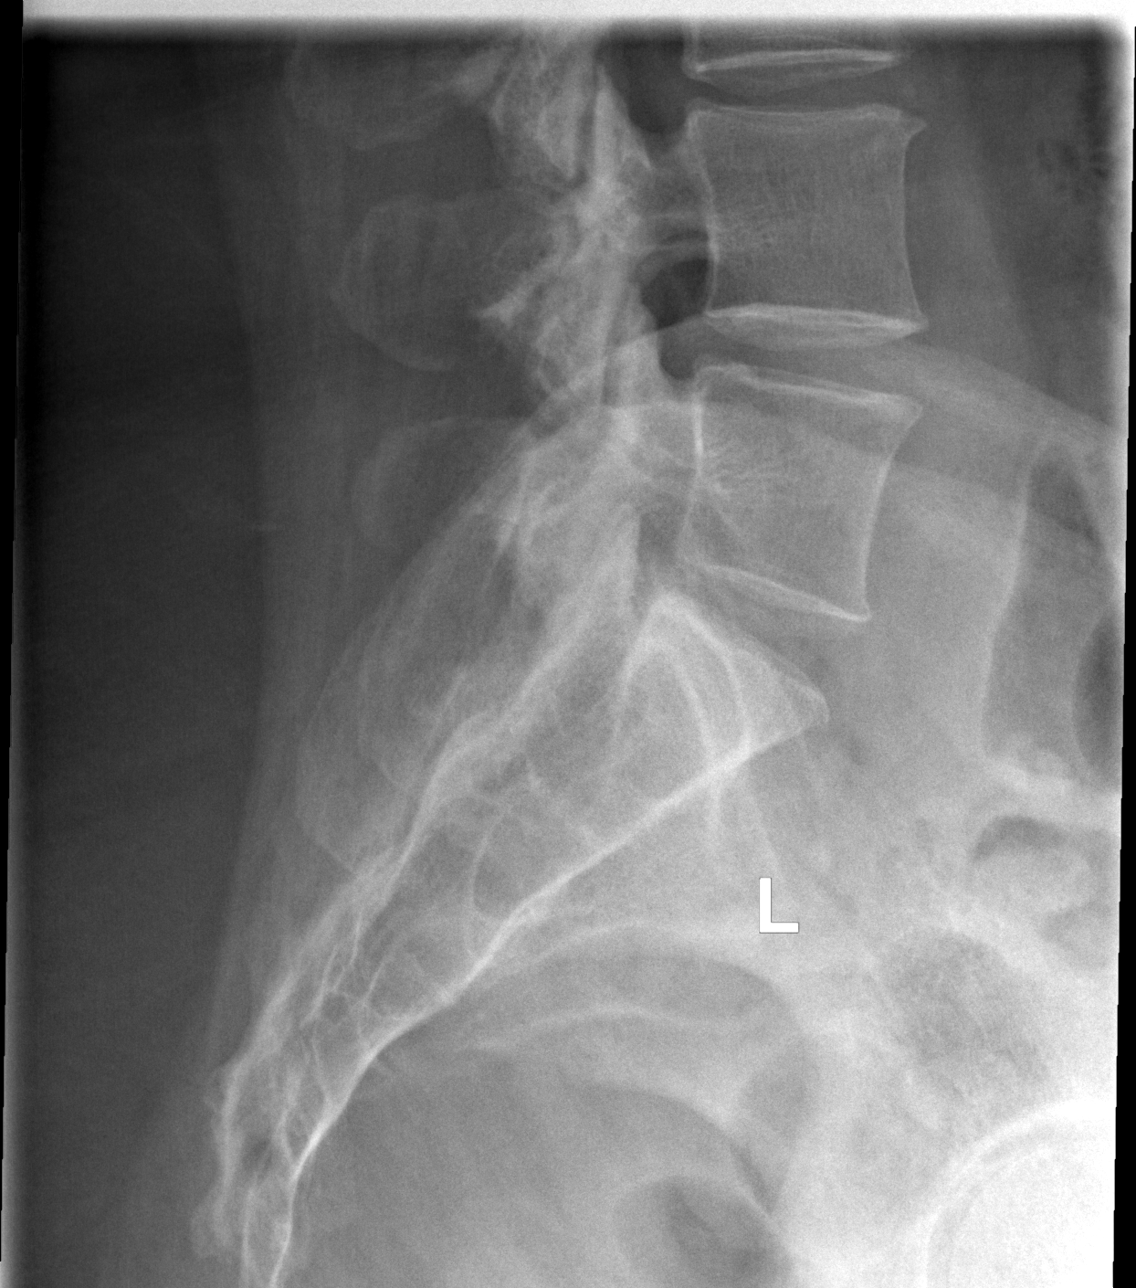

[5 of 5 positions shown; findings below may reference images not displayed]

EXAM

XR lumbar spine 5 view

INDICATION

back pain
PT WOKE UP WITH SEVERE LOW BACK PAIN WITH RADIATION TO RT HIP.  RG

TECHNIQUE

Five views of the lumbar spine

COMPARISONS

03/09/2021

FINDINGS

There are 5 lumbar type non-rib bearing vertebrae. The inferior most disc space will be labeled as

No endplate compression fracture, spondylolisthesis, or spondylolysis. Mild relative bony foraminal
narrowing at L5-S1. Mild facet arthropathy at L4-5 and L5-S1. Surgical clips in the right upper
quadrant and left upper quadrant with sutures. Mild to moderate stool burden.

IMPRESSION
1. No radiographic evidence of an acute osseous abnormality.
2. Constipation.

Tech Notes:

PT WOKE UP WITH SEVERE LOW BACK PAIN WITH RADIATION TO RT HIP.  RG

## 2024-05-27 ENCOUNTER — Encounter: Admit: 2024-05-27 | Discharge: 2024-05-27 | Payer: MEDICARE
